# Patient Record
Sex: Female | Born: 1945 | Race: Black or African American | Hispanic: No | Marital: Single
Health system: Southern US, Community
[De-identification: ages and names within clinical notes are randomized; demographics above are authoritative.]

## PROBLEM LIST (undated history)

## (undated) DIAGNOSIS — F0390 Unspecified dementia without behavioral disturbance: Secondary | ICD-10-CM

## (undated) DIAGNOSIS — I639 Cerebral infarction, unspecified: Secondary | ICD-10-CM

## (undated) DIAGNOSIS — E785 Hyperlipidemia, unspecified: Secondary | ICD-10-CM

---

## 2018-04-07 ENCOUNTER — Emergency Department (HOSPITAL_COMMUNITY)
Admission: EM | Admit: 2018-04-07 | Discharge: 2018-04-07 | Disposition: A | Discharge status: Home / Self Care | Payer: Medicare Other | Attending: Emergency Medicine | Admitting: Emergency Medicine

## 2018-04-07 ENCOUNTER — Emergency Department (HOSPITAL_COMMUNITY): Payer: Medicare Other

## 2018-04-07 ENCOUNTER — Other Ambulatory Visit: Payer: Self-pay

## 2018-04-07 DIAGNOSIS — Z79899 Other long term (current) drug therapy: Secondary | ICD-10-CM | POA: Insufficient documentation

## 2018-04-07 DIAGNOSIS — F0390 Unspecified dementia without behavioral disturbance: Secondary | ICD-10-CM | POA: Diagnosis not present

## 2018-04-07 DIAGNOSIS — Z7902 Long term (current) use of antithrombotics/antiplatelets: Secondary | ICD-10-CM | POA: Diagnosis not present

## 2018-04-07 DIAGNOSIS — R55 Syncope and collapse: Secondary | ICD-10-CM | POA: Insufficient documentation

## 2018-04-07 DIAGNOSIS — I951 Orthostatic hypotension: Secondary | ICD-10-CM

## 2018-04-07 LAB — URINALYSIS, ROUTINE W REFLEX MICROSCOPIC
Bilirubin Urine: NEGATIVE
Glucose, UA: NEGATIVE mg/dL
Hgb urine dipstick: NEGATIVE
KETONES UR: NEGATIVE mg/dL
Leukocytes, UA: NEGATIVE
Nitrite: NEGATIVE
PH: 5 (ref 5.0–8.0)
Protein, ur: NEGATIVE mg/dL
Specific Gravity, Urine: 1.028 (ref 1.005–1.030)

## 2018-04-07 LAB — I-STAT CHEM 8, ED
BUN: 26 mg/dL — ABNORMAL HIGH (ref 8–23)
Calcium, Ion: 1.17 mmol/L (ref 1.15–1.40)
Chloride: 108 mmol/L (ref 98–111)
Creatinine, Ser: 0.9 mg/dL (ref 0.44–1.00)
GLUCOSE: 103 mg/dL — AB (ref 70–99)
HEMATOCRIT: 46 % (ref 36.0–46.0)
Hemoglobin: 15.6 g/dL — ABNORMAL HIGH (ref 12.0–15.0)
POTASSIUM: 4.2 mmol/L (ref 3.5–5.1)
Sodium: 144 mmol/L (ref 135–145)
TCO2: 31 mmol/L (ref 22–32)

## 2018-04-07 LAB — COMPREHENSIVE METABOLIC PANEL
ALK PHOS: 87 U/L (ref 38–126)
ALT: 28 U/L (ref 0–44)
ANION GAP: 7 (ref 5–15)
AST: 28 U/L (ref 15–41)
Albumin: 4.4 g/dL (ref 3.5–5.0)
BUN: 21 mg/dL (ref 8–23)
CO2: 28 mmol/L (ref 22–32)
CREATININE: 0.86 mg/dL (ref 0.44–1.00)
Calcium: 9.9 mg/dL (ref 8.9–10.3)
Chloride: 109 mmol/L (ref 98–111)
GFR calc non Af Amer: >60 mL/min (ref >60)
GLUCOSE: 101 mg/dL — AB (ref 70–99)
Potassium: 3.9 mmol/L (ref 3.5–5.1)
Sodium: 144 mmol/L (ref 135–145)
TOTAL PROTEIN: 8.1 g/dL (ref 6.5–8.1)
Total Bilirubin: 1 mg/dL (ref 0.3–1.2)

## 2018-04-07 LAB — CBC WITH DIFFERENTIAL/PLATELET
Abs Immature Granulocytes: 0.05 10*3/uL (ref 0.00–0.07)
Basophils Absolute: 0 10*3/uL (ref 0.0–0.1)
Basophils Relative: 1 %
EOS PCT: 1 %
Eosinophils Absolute: 0 10*3/uL (ref 0.0–0.5)
HEMATOCRIT: 46.9 % — AB (ref 36.0–46.0)
HEMOGLOBIN: 14.8 g/dL (ref 12.0–15.0)
Immature Granulocytes: 1 %
LYMPHS ABS: 1.2 10*3/uL (ref 0.7–4.0)
LYMPHS PCT: 18 %
MCH: 30.5 pg (ref 26.0–34.0)
MCHC: 31.6 g/dL (ref 30.0–36.0)
MCV: 96.7 fL (ref 80.0–100.0)
MONO ABS: 0.5 10*3/uL (ref 0.1–1.0)
Monocytes Relative: 7 %
Neutro Abs: 4.7 10*3/uL (ref 1.7–7.7)
Neutrophils Relative %: 72 %
Platelets: 235 10*3/uL (ref 150–400)
RBC: 4.85 MIL/uL (ref 3.87–5.11)
RDW: 13.3 % (ref 11.5–15.5)
WBC: 6.4 10*3/uL (ref 4.0–10.5)
nRBC: 0 % (ref 0.0–0.2)

## 2018-04-07 LAB — I-STAT TROPONIN, ED: TROPONIN I, POC: 0 ng/mL (ref 0.00–0.08)

## 2018-04-07 LAB — CBG MONITORING, ED: Glucose-Capillary: 83 mg/dL (ref 70–99)

## 2018-04-07 MED ORDER — SODIUM CHLORIDE 0.9 % IV BOLUS
500.0000 mL | Freq: Once | INTRAVENOUS | Status: AC
  Administered 2018-04-07: 500 mL via INTRAVENOUS

## 2018-04-07 MED ORDER — DOCUSATE SODIUM 100 MG PO CAPS
200.0000 mg | ORAL_CAPSULE | Freq: Once | ORAL | Status: AC
  Administered 2018-04-07: 200 mg via ORAL
  Filled 2018-04-07: qty 2

## 2018-04-07 MED ORDER — SODIUM CHLORIDE 0.9 % IV SOLN: INTRAVENOUS | Status: DC

## 2018-04-07 NOTE — ED Notes (Signed)
Writer went back to room to try to obtain orthostatic vital signs and an EKG, per [place] Stand[place]bigail, [place]. Writer explained to pt and visitor, but visitor did not care to listen. Visitor explained to Clinical research associatewriter that Clinical research associatewriter was being rude to visitor and needed to "lose the attitude". Visitor stated, "we won't be doing any of that," referring to the EKG and orthostatic vital signs.  Cammy CopaAbigail, PA made aware.

## 2018-04-07 NOTE — ED Notes (Signed)
Pt removed IV from her arm, attemptign to leave. Daughter has convinced pt to stay. Pt being transported to XR

## 2018-04-07 NOTE — ED Notes (Addendum)
Writer began to introduce self to pt and family member interrupted Clinical research associatewriter by stating, "I need you to draw up some discharge paperwork". Writer told visitor in room that the PA would have to speak with them and some more tests needed to be completed before patient would be ready. Visitor getting louder with Clinical research associatewriter and told Clinical research associatewriter, " discharge paperwork can be done now. The blood has been done and there is the urine, y'all can call us at the house. I'm not waiting for no paperwork". Writer ok'ed and left to speak with PA.

## 2018-04-07 NOTE — ED Provider Notes (Signed)
Medical screening examination/treatment/procedure(s) were conducted as a shared visit with non-physician practitioner(s) and myself.  I personally evaluated the patient during the encounter.  EKG Interpretation  Date/Time:  Monday April 07 2018 17:43:52 EST Ventricular Rate:  85 PR Interval:    QRS Duration: 207 QT Interval:  380 QTC Calculation: 458 R Axis:   17 Text Interpretation:  Sinus rhythm Probable left ventricular hypertrophy Confirmed by Lorre NickAllen, Tegh Franek ([id number]) on 04/07/2018 7:358:[id number] PM 72 year old female here after a near syncopal episode prior to arrival.  Patient was hypotensive.  Patient is orthostatic here and requires IV fluids.  She has had a neurological baseline according to her daughter.  Will IV hydrate discharge home   Lorre NickAllen, Brenya Taulbee, MD 04/07/18 2047

## 2018-04-07 NOTE — ED Triage Notes (Signed)
Pt BIB EMS from grocery store.  Daughter reports pt was walking through the store holding shopping cart, when her knees buckled.  Pt did not actually fall.  No LOC.  Hx of stroke 2007, dementia AOx1 at baseline.  Daughter reports pt has had more frequent need for urination, suspects UTI.  Pt in briefs, often incontinent.  VS-  90/57; HR 77; R 18; CBG 167; O2 98% RA;

## 2018-04-07 NOTE — ED Provider Notes (Signed)
[facility]-EMERGENCY DEPT Provider Note   CSN: [id number] Arrival date & time: 04/07/18  1630     History   Chief Complaint No chief complaint on file.   HPI Adriana Fox is a 72 y.o. female who lives in [place] and is here visiting for the holidays with her daughter.  She has a history of dementia.  There is a level 5 caveat due to dementia.  Patient was shopping with her daughter today and while they were at the grocery store her daughter states that her knees buckled and she would like she is getting ready to pass out.  They sat her down and her daughter reports that her eyes Rolled back that she had some shaking all over..  Her daughter thinks that she is dehydrated because she has not been drinking thoroughly.  She is also had some mild constipation.  Daughter reports that she has never had a history of this.  She does not take blood thinners.  She has not had any melena or hematochezia. HPI  No past medical history on file.  There are no active problems to display for this patient.   The histories are not reviewed yet. Please review them in the "History" navigator section and refresh this SmartLink.   OB History   None      Home Medications    Prior to Admission medications   Medication Sig Start Date End Date Taking? Authorizing Provider  atorvastatin (LIPITOR) 40 MG tablet Take 40 mg by mouth daily.   Yes [provider]  clopidogrel (PLAVIX) 75 MG tablet Take 75 mg by mouth daily.   Yes [provider]  memantine (NAMENDA) 5 MG tablet Take 5 mg by mouth daily.   Yes [provider]  Multiple Vitamins-Minerals (MULTIVITAMIN ADULT) TABS Take 1 tablet by mouth daily.   Yes [provider]  Omega-3 Fatty Acids (FISH OIL) 1000 MG CAPS Take 1 capsule by mouth daily.   Yes [provider]    Family History No family history on file.  Social History Social History   Tobacco Use  . Smoking status:  Not on file  Substance Use Topics  . Alcohol use: Not on file  . Drug use: Not on file     Allergies   Patient has no known allergies.   Review of Systems Review of Systems  Unable to review systems due to dementia  Physical Exam Updated Vital Signs BP 121/72 (BP Location: Left Arm)   Pulse 80   Resp 18   SpO2 99%   Physical Exam Physical Exam  Nursing note and vitals reviewed. Constitutional: She is oriented to person, place, and time. She appears well-developed and well-nourished. No distress.  HENT:  Head: Normocephalic and atraumatic.  Eyes: Conjunctivae normal and EOM are normal. Pupils are equal, round, and reactive to light. No scleral icterus.  Neck: Normal range of motion.  Cardiovascular: Normal rate, regular rhythm and normal heart sounds.  Exam reveals no gallop and no friction rub.   No murmur heard. Pulmonary/Chest: Effort normal and breath sounds normal. No respiratory distress.  Abdominal: Soft. Bowel sounds are normal. She exhibits no distension and no mass. There is no tenderness. There is no guarding.  Neurological: She is alert and oriented to person, place, and time.  Skin: Skin is warm and dry. She is not diaphoretic.     ED Treatments / Results  Labs (all labs ordered are listed, but only abnormal results are displayed)  Labs Reviewed  I-STAT CHEM 8, ED - Abnormal; Notable for the following components:      Result Value   BUN 26 (*)    Glucose, Bld 103 (*)    Hemoglobin 15.6 (*)    All other components within normal limits  URINALYSIS, ROUTINE W REFLEX MICROSCOPIC  CBC WITH DIFFERENTIAL/PLATELET  COMPREHENSIVE METABOLIC PANEL  CBG MONITORING, ED  I-STAT TROPONIN, ED    EKG EKG Interpretation  Date/Time:  Monday April 07 2018 17:43:52 EST Ventricular Rate:  85 PR Interval:    QRS Duration: 207 QT Interval:  380 QTC Calculation: 458 R Axis:   17 Text Interpretation:  Sinus rhythm Probable left ventricular hypertrophy Confirmed  by Lorre NickAllen, Anthony ([id number]) on 04/07/2018 7:58:48 PM   Radiology Dg Chest 2 View  Result Date: 04/07/2018 CLINICAL DATA:  Near syncopal episode. EXAM: CHEST - 2 VIEW COMPARISON:  None. FINDINGS: Cardiomediastinal silhouette is normal. Mediastinal contours appear intact. Calcific atherosclerotic disease the aorta. There is no evidence of focal airspace consolidation, pleural effusion or pneumothorax. Bilateral lower lobe scarring versus atelectasis. Osseous structures are without acute abnormality. Soft tissues are grossly normal. IMPRESSION: Bilateral lower lobe scarring versus atelectasis. Calcific atherosclerotic disease of the aorta. Electronically Signed   By: Ted Mcalpineobrinka  Dimitrova M.D.   On: 04/07/2018 19:30    Procedures Procedures (including critical care time)  Medications Ordered in ED Medications  0.9 %  sodium chloride infusion (has no administration in time range)     Initial Impression / Assessment and Plan / ED Course  I have reviewed the triage vital signs and the nursing notes.  Pertinent labs & imaging results that were available during my care of the patient were reviewed by me and considered in my medical decision making (see chart for details).     72 year old female who had a presyncopal episode.  Her EKG is reviewed and shows no significant abnormalities.  No signs of ischemia, no evidence of arrhythmia, Brugada or WPW.  Her chest x-ray I personally reviewed shows no widened mediastinum, pleural effusions.  Patient labs are largely reassuring without evidence of anemia.  She has positive orthostatic vital signs.  Patient was given fluids with improvement.  Her daughter requests Colace.  No evidence of urinary tract infection.  She was appropriate for discharge at this time.  Final Clinical Impressions(s) / ED Diagnoses   Final diagnoses:  None    ED Discharge Orders    None       Arthor CaptainHarris, Angellica Maddison, PA-C 04/07/18 2344    Lorre NickAllen, Anthony, MD 04/12/18 [phone]

## 2018-04-07 NOTE — ED Notes (Signed)
Writer went in pt's room to introduce myself and complete orders for bolus. Pt's family advised that a new IV needs to be started in order to receive fluids. Pt's family states "we are not doing all of that" and denied IV start and fluids. Made PA aware.

## 2018-04-07 NOTE — ED Notes (Signed)
Abigial, PA at bedside.

## 2019-05-13 ENCOUNTER — Emergency Department (HOSPITAL_COMMUNITY): Payer: Medicare Other

## 2019-05-13 ENCOUNTER — Other Ambulatory Visit: Payer: Self-pay

## 2019-05-13 ENCOUNTER — Inpatient Hospital Stay (HOSPITAL_COMMUNITY)
Admission: EM | Admit: 2019-05-13 | Discharge: 2019-05-24 | DRG: 871 | Disposition: A | Discharge status: Skilled Nursing Facility | Payer: Medicare Other | Attending: Internal Medicine | Admitting: Internal Medicine

## 2019-05-13 ENCOUNTER — Encounter (HOSPITAL_COMMUNITY): Payer: Self-pay

## 2019-05-13 DIAGNOSIS — Z7902 Long term (current) use of antithrombotics/antiplatelets: Secondary | ICD-10-CM

## 2019-05-13 DIAGNOSIS — Z681 Body mass index (BMI) 19 or less, adult: Secondary | ICD-10-CM | POA: Diagnosis not present

## 2019-05-13 DIAGNOSIS — I10 Essential (primary) hypertension: Secondary | ICD-10-CM | POA: Diagnosis present

## 2019-05-13 DIAGNOSIS — U071 COVID-19: Secondary | ICD-10-CM | POA: Diagnosis present

## 2019-05-13 DIAGNOSIS — E876 Hypokalemia: Secondary | ICD-10-CM | POA: Diagnosis present

## 2019-05-13 DIAGNOSIS — E860 Dehydration: Secondary | ICD-10-CM | POA: Diagnosis present

## 2019-05-13 DIAGNOSIS — A4189 Other specified sepsis: Principal | ICD-10-CM | POA: Diagnosis present

## 2019-05-13 DIAGNOSIS — G92 Toxic encephalopathy: Secondary | ICD-10-CM | POA: Diagnosis not present

## 2019-05-13 DIAGNOSIS — E785 Hyperlipidemia, unspecified: Secondary | ICD-10-CM | POA: Diagnosis present

## 2019-05-13 DIAGNOSIS — R0602 Shortness of breath: Secondary | ICD-10-CM | POA: Diagnosis present

## 2019-05-13 DIAGNOSIS — I69354 Hemiplegia and hemiparesis following cerebral infarction affecting left non-dominant side: Secondary | ICD-10-CM

## 2019-05-13 DIAGNOSIS — F0390 Unspecified dementia without behavioral disturbance: Secondary | ICD-10-CM | POA: Diagnosis present

## 2019-05-13 DIAGNOSIS — I6930 Unspecified sequelae of cerebral infarction: Secondary | ICD-10-CM

## 2019-05-13 DIAGNOSIS — R64 Cachexia: Secondary | ICD-10-CM | POA: Diagnosis present

## 2019-05-13 DIAGNOSIS — J1282 Pneumonia due to coronavirus disease 2019: Secondary | ICD-10-CM | POA: Diagnosis present

## 2019-05-13 DIAGNOSIS — A419 Sepsis, unspecified organism: Secondary | ICD-10-CM | POA: Diagnosis not present

## 2019-05-13 DIAGNOSIS — F419 Anxiety disorder, unspecified: Secondary | ICD-10-CM | POA: Diagnosis not present

## 2019-05-13 DIAGNOSIS — R9431 Abnormal electrocardiogram [ECG] [EKG]: Secondary | ICD-10-CM | POA: Diagnosis present

## 2019-05-13 DIAGNOSIS — Z79899 Other long term (current) drug therapy: Secondary | ICD-10-CM

## 2019-05-13 DIAGNOSIS — J9601 Acute respiratory failure with hypoxia: Secondary | ICD-10-CM | POA: Diagnosis present

## 2019-05-13 DIAGNOSIS — R791 Abnormal coagulation profile: Secondary | ICD-10-CM | POA: Diagnosis not present

## 2019-05-13 DIAGNOSIS — E870 Hyperosmolality and hypernatremia: Secondary | ICD-10-CM | POA: Diagnosis present

## 2019-05-13 DIAGNOSIS — J1289 Other viral pneumonia: Secondary | ICD-10-CM | POA: Diagnosis not present

## 2019-05-13 HISTORY — DX: Cerebral infarction, unspecified: I63.9

## 2019-05-13 HISTORY — DX: Hyperlipidemia, unspecified: E78.5

## 2019-05-13 HISTORY — DX: Unspecified dementia, unspecified severity, without behavioral disturbance, psychotic disturbance, mood disturbance, and anxiety: F03.90

## 2019-05-13 LAB — CBC WITH DIFFERENTIAL/PLATELET
Abs Immature Granulocytes: 0.07 10*3/uL (ref 0.00–0.07)
Basophils Absolute: 0.1 10*3/uL (ref 0.0–0.1)
Basophils Relative: 1 %
Eosinophils Absolute: 0.1 10*3/uL (ref 0.0–0.5)
Eosinophils Relative: 1 %
HCT: 40.9 % (ref 36.0–46.0)
Hemoglobin: 13.2 g/dL (ref 12.0–15.0)
Immature Granulocytes: 1 %
Lymphocytes Relative: 20 %
Lymphs Abs: 1.9 10*3/uL (ref 0.7–4.0)
MCH: 30.7 pg (ref 26.0–34.0)
MCHC: 32.3 g/dL (ref 30.0–36.0)
MCV: 95.1 fL (ref 80.0–100.0)
Monocytes Absolute: 0.6 10*3/uL (ref 0.1–1.0)
Monocytes Relative: 6 %
Neutro Abs: 7 10*3/uL (ref 1.7–7.7)
Neutrophils Relative %: 71 %
Platelets: 275 10*3/uL (ref 150–400)
RBC: 4.3 MIL/uL (ref 3.87–5.11)
RDW: 15.9 % — ABNORMAL HIGH (ref 11.5–15.5)
WBC: 9.6 10*3/uL (ref 4.0–10.5)
nRBC: 4.8 % — ABNORMAL HIGH (ref 0.0–0.2)

## 2019-05-13 LAB — COMPREHENSIVE METABOLIC PANEL
ALT: 54 U/L — ABNORMAL HIGH (ref 0–44)
AST: 56 U/L — ABNORMAL HIGH (ref 15–41)
Albumin: 2.8 g/dL — ABNORMAL LOW (ref 3.5–5.0)
Alkaline Phosphatase: 135 U/L — ABNORMAL HIGH (ref 38–126)
Anion gap: 14 (ref 5–15)
BUN: 29 mg/dL — ABNORMAL HIGH (ref 8–23)
CO2: 22 mmol/L (ref 22–32)
Calcium: 8.7 mg/dL — ABNORMAL LOW (ref 8.9–10.3)
Chloride: 110 mmol/L (ref 98–111)
Creatinine, Ser: 1 mg/dL (ref 0.44–1.00)
GFR calc Af Amer: >60 mL/min (ref >60)
GFR calc non Af Amer: 56 mL/min — ABNORMAL LOW (ref >60)
Glucose, Bld: 120 mg/dL — ABNORMAL HIGH (ref 70–99)
Potassium: 3.1 mmol/L — ABNORMAL LOW (ref 3.5–5.1)
Sodium: 146 mmol/L — ABNORMAL HIGH (ref 135–145)
Total Bilirubin: 1 mg/dL (ref 0.3–1.2)
Total Protein: 7.2 g/dL (ref 6.5–8.1)

## 2019-05-13 LAB — LACTIC ACID, PLASMA
Lactic Acid, Venous: 2.7 mmol/L (ref 0.5–1.9)
Lactic Acid, Venous: 2.4 mmol/L (ref 0.5–1.9)

## 2019-05-13 LAB — D-DIMER, QUANTITATIVE: D-Dimer, Quant: >20 ug/mL-FEU — ABNORMAL HIGH (ref 0.00–0.50)

## 2019-05-13 LAB — FIBRINOGEN: Fibrinogen: 355 mg/dL (ref 210–475)

## 2019-05-13 LAB — TRIGLYCERIDES: Triglycerides: 85 mg/dL (ref <150)

## 2019-05-13 LAB — FERRITIN: Ferritin: 336 ng/mL — ABNORMAL HIGH (ref 11–307)

## 2019-05-13 LAB — ABO/RH: ABO/RH(D): O POS

## 2019-05-13 LAB — C-REACTIVE PROTEIN: CRP: 4.3 mg/dL — ABNORMAL HIGH (ref <1.0)

## 2019-05-13 LAB — POC SARS CORONAVIRUS 2 AG -  ED: SARS Coronavirus 2 Ag: NEGATIVE

## 2019-05-13 LAB — LACTATE DEHYDROGENASE: LDH: 353 U/L — ABNORMAL HIGH (ref 98–192)

## 2019-05-13 LAB — SARS CORONAVIRUS 2 (TAT 6-24 HRS): SARS Coronavirus 2: POSITIVE — AB

## 2019-05-13 LAB — PROCALCITONIN: Procalcitonin: 0.19 ng/mL

## 2019-05-13 MED ORDER — SODIUM CHLORIDE 0.9 % IV SOLN
100.0000 mg | Freq: Every day | INTRAVENOUS | Status: DC
  Administered 2019-05-14 – 2019-05-16 (×3): 100 mg via INTRAVENOUS
  Filled 2019-05-13 (×4): qty 20

## 2019-05-13 MED ORDER — ASCORBIC ACID 500 MG PO TABS
500.0000 mg | ORAL_TABLET | Freq: Every day | ORAL | Status: DC
  Administered 2019-05-13 – 2019-05-24 (×12): 500 mg via ORAL
  Filled 2019-05-13 (×12): qty 1

## 2019-05-13 MED ORDER — SODIUM CHLORIDE 0.9 % IV SOLN: Freq: Once | INTRAVENOUS | Status: AC

## 2019-05-13 MED ORDER — ENOXAPARIN SODIUM 40 MG/0.4ML ~~LOC~~ SOLN
40.0000 mg | SUBCUTANEOUS | Status: DC
  Administered 2019-05-13 – 2019-05-14 (×2): 40 mg via SUBCUTANEOUS
  Filled 2019-05-13 (×2): qty 0.4

## 2019-05-13 MED ORDER — POTASSIUM CHLORIDE 10 MEQ/100ML IV SOLN
10.0000 meq | INTRAVENOUS | Status: DC
  Administered 2019-05-13: 10 meq via INTRAVENOUS
  Filled 2019-05-13: qty 100

## 2019-05-13 MED ORDER — MEMANTINE HCL 5 MG PO TABS
5.0000 mg | ORAL_TABLET | Freq: Every day | ORAL | Status: DC
  Administered 2019-05-13 – 2019-05-24 (×12): 5 mg via ORAL
  Filled 2019-05-13 (×13): qty 1

## 2019-05-13 MED ORDER — SODIUM CHLORIDE 0.9 % IV SOLN: INTRAVENOUS | Status: DC

## 2019-05-13 MED ORDER — ALBUTEROL SULFATE HFA 108 (90 BASE) MCG/ACT IN AERS
2.0000 | INHALATION_SPRAY | Freq: Four times a day (QID) | RESPIRATORY_TRACT | Status: DC
  Administered 2019-05-13 – 2019-05-24 (×38): 2 via RESPIRATORY_TRACT
  Filled 2019-05-13 (×2): qty 6.7

## 2019-05-13 MED ORDER — FAMOTIDINE 20 MG PO TABS
20.0000 mg | ORAL_TABLET | Freq: Two times a day (BID) | ORAL | Status: DC
  Administered 2019-05-13 – 2019-05-24 (×22): 20 mg via ORAL
  Filled 2019-05-13 (×24): qty 1

## 2019-05-13 MED ORDER — SODIUM CHLORIDE 0.9% FLUSH
3.0000 mL | Freq: Two times a day (BID) | INTRAVENOUS | Status: DC
  Administered 2019-05-13 – 2019-05-24 (×22): 3 mL via INTRAVENOUS

## 2019-05-13 MED ORDER — CLOPIDOGREL BISULFATE 75 MG PO TABS
75.0000 mg | ORAL_TABLET | Freq: Every day | ORAL | Status: DC
  Administered 2019-05-13 – 2019-05-24 (×12): 75 mg via ORAL
  Filled 2019-05-13 (×12): qty 1

## 2019-05-13 MED ORDER — ATORVASTATIN CALCIUM 40 MG PO TABS
40.0000 mg | ORAL_TABLET | Freq: Every day | ORAL | Status: DC
  Administered 2019-05-13 – 2019-05-24 (×12): 40 mg via ORAL
  Filled 2019-05-13 (×12): qty 1

## 2019-05-13 MED ORDER — GUAIFENESIN-DM 100-10 MG/5ML PO SYRP
10.0000 mL | ORAL_SOLUTION | ORAL | Status: DC | PRN
  Administered 2019-05-17 – 2019-05-21 (×5): 10 mL via ORAL
  Filled 2019-05-13 (×5): qty 10

## 2019-05-13 MED ORDER — ZINC SULFATE 220 (50 ZN) MG PO CAPS
220.0000 mg | ORAL_CAPSULE | Freq: Every day | ORAL | Status: DC
  Administered 2019-05-13 – 2019-05-24 (×10): 220 mg via ORAL
  Filled 2019-05-13 (×12): qty 1

## 2019-05-13 MED ORDER — DEXAMETHASONE SODIUM PHOSPHATE 10 MG/ML IJ SOLN
6.0000 mg | INTRAMUSCULAR | Status: DC
  Administered 2019-05-13: 6 mg via INTRAVENOUS
  Filled 2019-05-13: qty 1

## 2019-05-13 MED ORDER — POTASSIUM CHLORIDE 10 MEQ/100ML IV SOLN
10.0000 meq | INTRAVENOUS | Status: AC
  Administered 2019-05-13 (×2): 10 meq via INTRAVENOUS
  Filled 2019-05-13 (×2): qty 100

## 2019-05-13 MED ORDER — SODIUM CHLORIDE 0.9 % IV SOLN
200.0000 mg | Freq: Once | INTRAVENOUS | Status: AC
  Administered 2019-05-13: 200 mg via INTRAVENOUS
  Filled 2019-05-13: qty 40

## 2019-05-13 MED ORDER — POTASSIUM CHLORIDE 10 MEQ/100ML IV SOLN
10.0000 meq | INTRAVENOUS | Status: AC
  Administered 2019-05-13: 10 meq via INTRAVENOUS
  Filled 2019-05-13: qty 100

## 2019-05-13 MED ORDER — IOHEXOL 350 MG/ML SOLN
100.0000 mL | Freq: Once | INTRAVENOUS | Status: AC | PRN
  Administered 2019-05-13: 100 mL via INTRAVENOUS

## 2019-05-13 NOTE — ED Notes (Signed)
Report given to floor.  Pt requested some water, given.  Pt appears to beable to express wants and needs.

## 2019-05-13 NOTE — Plan of Care (Signed)
  Problem: Respiratory: Goal: Will maintain a patent airway Outcome: Progressing   

## 2019-05-13 NOTE — ED Notes (Signed)
Placed purewick on patient for urine collection

## 2019-05-13 NOTE — H&P (Signed)
History and Physical    Adriana Fox PIR:[id number] DOB: 12/17/1945 DOA: 05/13/2019  Referring MD/NP/PA: Jennette Kettle, MD PCP: Patient, No Pcp Per  Patient coming from: Home via EMS  Chief Complaint: Shortness of breath  I have personally briefly reviewed patient's old medical records in [place]   HPI: Adriana Fox is a 73 y.o. female with medical history significant of dementia, hyperlipidemia, CVA with residual left-sided weakness, and diagnosed with COVID-19 on 12/17.  Patient presents with complaints of worsening shortness of breath.  History is obtained from the patient's daughter in over the phone due to patient's significant dementia.  Patient had been admitted at had been admitted to [facility] for 2 days last week after being diagnosed with COVID-19, but was discharged home without any prescriptions.  Since being home patient continues to be short of breath with any activity.  Normally getting around with the use of a cane.  After getting home from the hospital patient started having this dry cough and her breathing appeared labored whenever she was getting around.  Over the last 3 days patient has been restless at night and unable to sleep due to her symptoms.  Her daughter did not report her mother having any recurrence of fevers. Denies having any significant nausea not any reports of nausea, vomiting, or diarrhea.  [name] does not report any complaints at this time.  En route with EMS patient was noted to be febrile up to 101.3 F, O2 saturation 98% on room air, heart rate 118, and blood pressure 103/47.  Patient was given 400 mL of normal saline IV fluids with improvement.  ED Course: Upon admission into the emergency department patient was noted to have a temperature of 99 F, pulse 110-116, respiration 22-36, and O2 saturation maintained on room air.  Point-of-care COVID-19 screening was negative.  Chest x-ray showed patchy bilateral opacities.  CT  angiogram significant for interstitial opacities likely related to history of COVID-19.  TRH called to admit.  Review of Systems  Unable to perform ROS: Dementia  Constitutional: Positive for malaise/fatigue. Negative for fever.  Respiratory: Positive for cough and shortness of breath.   Psychiatric/Behavioral: Positive for memory loss.    Past Medical History:  Diagnosis Date  . CVA (cerebral vascular accident) ([place])   . Dementia ([place])   . Hyperlipidemia     History reviewed. No pertinent surgical history.  Has no history of tobacco, alcohol, and drug.  No Known Allergies  Family History  Problem Relation Age of Onset  . Diabetes Mother     Prior to Admission medications   Medication Sig Start Date End Date Taking? Authorizing Provider  atorvastatin (LIPITOR) 40 MG tablet Take 40 mg by mouth daily.    [provider]  clopidogrel (PLAVIX) 75 MG tablet Take 75 mg by mouth daily.    [provider]  memantine (NAMENDA) 5 MG tablet Take 5 mg by mouth daily.    [provider]  Multiple Vitamins-Minerals (MULTIVITAMIN ADULT) TABS Take 1 tablet by mouth daily.    [provider]  Omega-3 Fatty Acids (FISH OIL) 1000 MG CAPS Take 1 capsule by mouth daily.    [provider]    Physical Exam:  Constitutional: Thin elderly female who appears to be ill but in no acute distress. Vitals:   05/13/19 0530 05/13/19 0600 05/13/19 0631 05/13/19 0700  BP: 99/78 112/79 103/82 105/81  Pulse: (!) 110 (!) 111 (!) 113 (!) 109  Resp: Marland Kitchen)  36 (!) 22 (!) 26 (!) 32  Temp:      TempSrc:      SpO2: 94% 98% 95% 96%  Weight:      Height:       Eyes: PERRL, lids and conjunctivae normal ENMT: Mucous membranes are dry. Posterior pharynx clear of any exudate or lesions.  Neck: normal, supple, no masses, no thyromegaly Respiratory: Tachypneic with no significant wheezes or rhonchi appreciated.  Patient currently maintaining O2 saturations on room air.   Appears to be in some respiratory distress after saying 2-3 words.   Cardiovascular: Tachycardic, no murmurs / rubs / gallops. No extremity edema. 2+ pedal pulses. No carotid bruits.  Abdomen: no tenderness, no masses palpated. No hepatosplenomegaly. Bowel sounds positive.  Musculoskeletal: no clubbing / cyanosis. No joint deformity upper and lower extremities.  Mild contracture noted of the left hand.  Normal muscle tone.  Skin: no rashes, lesions, ulcers. No induration Neurologic: CN 2-12 grossly intact. Sensation intact, DTR normal. Strength 4/5 on the left upper and lower extremity.  Strength 5/5 in the right upper and lower extremity.  Psychiatric: Poor memory.  Patient alert and oriented only to self at this time.  Labs on Admission: I have personally reviewed following labs and imaging studies  CBC: Recent Labs  Lab 05/13/19 0420  WBC 9.6  NEUTROABS 7.0  HGB 13.2  HCT 40.9  MCV 95.1  PLT 275   Basic Metabolic Panel: Recent Labs  Lab 05/13/19 0420  NA 146*  K 3.1*  CL 110  CO2 22  GLUCOSE 120*  BUN 29*  CREATININE 1.00  CALCIUM 8.7*   GFR: Estimated Creatinine Clearance: 43 mL/min (by C-G formula based on SCr of 1 mg/dL). Liver Function Tests: Recent Labs  Lab 05/13/19 0420  AST 56*  ALT 54*  ALKPHOS 135*  BILITOT 1.0  PROT 7.2  ALBUMIN 2.8*   No results for input(s): LIPASE, AMYLASE in the last 168 hours. No results for input(s): AMMONIA in the last 168 hours. Coagulation Profile: No results for input(s): INR, PROTIME in the last 168 hours. Cardiac Enzymes: No results for input(s): CKTOTAL, CKMB, CKMBINDEX, TROPONINI in the last 168 hours. BNP (last 3 results) No results for input(s): PROBNP in the last 8760 hours. HbA1C: No results for input(s): HGBA1C in the last 72 hours. CBG: No results for input(s): GLUCAP in the last 168 hours. Lipid Profile: Recent Labs    05/13/19 0420  TRIG 85   Thyroid Function Tests: No results for input(s): TSH,  T4TOTAL, FREET4, T3FREE, THYROIDAB in the last 72 hours. Anemia Panel: Recent Labs    05/13/19 0420  FERRITIN 336*   Urine analysis:    Component Value Date/Time   COLORURINE YELLOW 04/07/2018 2006   APPEARANCEUR HAZY (A) 04/07/2018 2006   LABSPEC 1.028 04/07/2018 2006   PHURINE 5.0 04/07/2018 2006   GLUCOSEU NEGATIVE 04/07/2018 2006   HGBUR NEGATIVE 04/07/2018 2006   BILIRUBINUR NEGATIVE 04/07/2018 2006   KETONESUR NEGATIVE 04/07/2018 2006   PROTEINUR NEGATIVE 04/07/2018 2006   NITRITE NEGATIVE 04/07/2018 2006   LEUKOCYTESUR NEGATIVE 04/07/2018 2006   Sepsis Labs: No results found for this or any previous visit (from the past 240 hour(s)).   Radiological Exams on Admission: DG Chest Port 1 View  Result Date: 05/13/2019 CLINICAL DATA:  Shortness of breath.  COVID-19 positive EXAM: PORTABLE CHEST 1 VIEW COMPARISON:  04/07/2018 FINDINGS: Diffuse interstitial coarsening with patchy airspace type opacities at the left more than right base. No effusion or pneumothorax.  Normal heart size and stable aortic contours. Artifact from EKG leads IMPRESSION: Patchy airspace and interstitial opacity correlating with history of COVID-19. Electronically Signed   By: Marnee Spring M.D.   On: 05/13/2019 04:34    EKG: Independently reviewed.  Sinus tachycardia 116 bpm with QTc 506  Assessment/Plan Sepsis secondary to pneumonia due to COVID-19: Patient was initially noted to be febrile up to 101.3 F with EMS, tachycardic, and tachypneic.  Chest x-ray showing patchy interstitial opacities concerning for pneumonia due to COVID-19.  Patient maintaining O2 saturations on room air currently.  Labs significant for lactic acid of 2.7. -Admit to a medical telemetry bed -COVID-19 admission order set utilized -Continuous pulse oximetry with nasal cannula oxygen as needed to maintain O2 saturations greater than 90% -Albuterol inhaler -Decadron 6 mg IV -Pepcid for GI prophylaxis -Antitussives as  needed -Vitamin C and zinc -Remdesivir for pharmacy  Hypokalemia: Acute.  Initial potassium noted to be 3.1. -Give 40 mEq of potassium chloride -Continue to monitor and replace as needed  Prolonged QT interval: Acute initial QTc noted to be 506 on admission. -Limit QT prolonging medications -Correct electrolyte abnormalities -Recheck EKG in a.m.  Dehydration: Patient with significantly elevated BUN to creatinine ratio suggesting dehydration. -Normal saline IV fluids 75 mL/h  Dementia: At baseline patient has dementia is severe, and she is only oriented to self. -Continue Namenda  CVA with residual deficit: Patient previously had a stroke in 2012 with residual left-sided weakness and memory deficit.  He ambulates with the use of a cane at baseline. -Up with assistance  Hyperlipidemia -Continue atorvastatin  DVT prophylaxis:Lovenox  Code: Full Family communication: Discussed plan of care with the patient's daughter Ronney Asters over the phone.  Other person listed should not be called. Disposition: To be determined Consults called: None Admission status: Inpatient  Clydie Braun MD [facility] Pager [phone]   If 7PM-7AM, please contact night-coverage [email or web address] Password [email or web address]  05/13/2019, 8:28 AM

## 2019-05-13 NOTE — ED Notes (Signed)
Dinner Tray Ordered @ 1712. 

## 2019-05-13 NOTE — ED Notes (Signed)
Daughter at bedside informed this RN she would be leaving to go to work. Daughter Noland Fordyce requests to be updated through-out  mothers care.

## 2019-05-13 NOTE — ED Triage Notes (Signed)
Pt arrives via [facility] from home c/o weakness, hypotension, and dyspnea. Pt tested positive for COVID on 12/17. PMHX: dementia. Alert and oriented to baseline. VS: BP 103/47, HR 118, Temp 101.3, SPO2 98% RA.

## 2019-05-13 NOTE — ED Notes (Signed)
UTO lactic from line, phlebotomy to try.

## 2019-05-13 NOTE — ED Provider Notes (Signed)
[facility] EMERGENCY DEPARTMENT Provider Note   CSN: [id number] Arrival date & time: 05/13/19  6440     History Chief Complaint  Patient presents with  . Hypotension  . Weakness  . Shortness of Breath    Adriana Fox is a 73 y.o. female.  The history is provided by the EMS personnel.  Weakness Shortness of Breath   LEVEL V CAVEAT:  DEMENTIA  73 y.o. F with hx of dementia, presenting to the ED with worsening SOB.  She was reportedly diagnosed with COVID-19 on 04/30/19 by test from PCP office.  Has had worsening symptoms.  She denies any significant cough or chest pain.  No nausea/vomiting.  States she has been able to eat/drink.  She was hypotensive with EMS around 90 systolic which improved with 400cc bolus.  BP on arrival 103/76.    Past Medical History:  Diagnosis Date  . Dementia (HCC)     There are no problems to display for this patient.     OB History   No obstetric history on file.     No family history on file.  Social History   Tobacco Use  . Smoking status: Not on file  Substance Use Topics  . Alcohol use: Not on file  . Drug use: Not on file    Home Medications Prior to Admission medications   Medication Sig Start Date End Date Taking? Authorizing Provider  atorvastatin (LIPITOR) 40 MG tablet Take 40 mg by mouth daily.    [provider]  clopidogrel (PLAVIX) 75 MG tablet Take 75 mg by mouth daily.    [provider]  memantine (NAMENDA) 5 MG tablet Take 5 mg by mouth daily.    [provider]  Multiple Vitamins-Minerals (MULTIVITAMIN ADULT) TABS Take 1 tablet by mouth daily.    [provider]  Omega-3 Fatty Acids (FISH OIL) 1000 MG CAPS Take 1 capsule by mouth daily.    [provider]    Allergies    Patient has no known allergies.  Review of Systems   Review of Systems  Unable to perform ROS: Dementia    Physical Exam Updated Vital Signs BP 104/76 (BP Location:  Right Arm)   Pulse (!) 116   Temp 99 F (37.2 C) (Oral)   Resp (!) 30   Ht 5\' 6"  (1.676 Fox)   Wt 54.4 kg   SpO2 97%   BMI 19.37 kg/Fox   Physical Exam Vitals and nursing note reviewed.  Constitutional:      Appearance: She is well-developed.     Comments: Pleasantly confused, fidgeting with EKG leads during exam  HENT:     Head: Normocephalic and atraumatic.  Eyes:     Conjunctiva/sclera: Conjunctivae normal.     Pupils: Pupils are equal, round, and reactive to light.  Cardiovascular:     Rate and Rhythm: Normal rate and regular rhythm.     Heart sounds: Normal heart sounds.  Pulmonary:     Effort: Pulmonary effort is normal. Tachypnea present.     Breath sounds: Normal breath sounds. No wheezing or rhonchi.     Comments: tachypneic around 30 but lungs grossly clear Abdominal:     General: Bowel sounds are normal.     Palpations: Abdomen is soft.  Musculoskeletal:        General: Normal range of motion.     Cervical back: Normal range of motion.  Skin:    General: Skin is warm and dry.  Neurological:  Mental Status: She is alert and oriented to person, place, and time.     ED Results / Procedures / Treatments   Labs (all labs ordered are listed, but only abnormal results are displayed) Labs Reviewed  LACTIC ACID, PLASMA - Abnormal; Notable for the following components:      Result Value   Lactic Acid, Venous 2.7 (*)    All other components within normal limits  CBC WITH DIFFERENTIAL/PLATELET - Abnormal; Notable for the following components:   RDW 15.9 (*)    nRBC 4.8 (*)    All other components within normal limits  COMPREHENSIVE METABOLIC PANEL - Abnormal; Notable for the following components:   Sodium 146 (*)    Potassium 3.1 (*)    Glucose, Bld 120 (*)    BUN 29 (*)    Calcium 8.7 (*)    Albumin 2.8 (*)    AST 56 (*)    ALT 54 (*)    Alkaline Phosphatase 135 (*)    GFR calc non Af Amer 56 (*)    All other components within normal limits  D-DIMER,  QUANTITATIVE (NOT AT [facility]) - Abnormal; Notable for the following components:   D-Dimer, Quant >20.00 (*)    All other components within normal limits  LACTATE DEHYDROGENASE - Abnormal; Notable for the following components:   LDH 353 (*)    All other components within normal limits  FERRITIN - Abnormal; Notable for the following components:   Ferritin 336 (*)    All other components within normal limits  C-REACTIVE PROTEIN - Abnormal; Notable for the following components:   CRP 4.3 (*)    All other components within normal limits  CULTURE, BLOOD (ROUTINE X 2)  CULTURE, BLOOD (ROUTINE X 2)  SARS CORONAVIRUS 2 (TAT 6-24 HRS)  TRIGLYCERIDES  FIBRINOGEN  LACTIC ACID, PLASMA  PROCALCITONIN  PATHOLOGIST SMEAR REVIEW  POC SARS CORONAVIRUS 2 AG -  ED    EKG None  Radiology DG Chest Port 1 View  Result Date: 05/13/2019 CLINICAL DATA:  Shortness of breath.  COVID-19 positive EXAM: PORTABLE CHEST 1 VIEW COMPARISON:  04/07/2018 FINDINGS: Diffuse interstitial coarsening with patchy airspace type opacities at the left more than right base. No effusion or pneumothorax. Normal heart size and stable aortic contours. Artifact from EKG leads IMPRESSION: Patchy airspace and interstitial opacity correlating with history of COVID-19. Electronically Signed   By: Monte Fantasia Fox.D.   On: 05/13/2019 04:34    Procedures Procedures (including critical care time)  CRITICAL CARE Performed by: Larene Pickett   Total critical care time: 40 minutes  Critical care time was exclusive of separately billable procedures and treating other patients.  Critical care was necessary to treat or prevent imminent or life-threatening deterioration.  Critical care was time spent personally by me on the following activities: development of treatment plan with patient and/or surrogate as well as nursing, discussions with consultants, evaluation of patient's response to treatment, examination of patient, obtaining  history from patient or surrogate, ordering and performing treatments and interventions, ordering and review of laboratory studies, ordering and review of radiographic studies, pulse oximetry and re-evaluation of patient's condition.   Medications Ordered in ED Medications - No data to display  ED Course  I have reviewed the triage vital signs and the nursing notes.  Pertinent labs & imaging results that were available during my care of the patient were reviewed by me and considered in my medical decision making (see chart for details).  MDM Rules/Calculators/A&P  73 y.o. F here with SOB.  Diagnosed with COVID on 04/30/19, worsening SOB now.  Initially was hypotensive with EMS but responded well to 400cc bolus.  She is AAO to her baseline here, seems pleasantly confused, fidgeting with EKG leads during exam.  She is tachypneic and tachycardic, breathing around 30-40 times a minute.  Her lung sounds are grossly clear and she is in no acute distress.  Difficulty obtaining pulse ox due to her nail polish, readings anywhere from 85 to 91%.  Will obtain labs, portable CXR.  COVID test result not available in Epic so will obtain swab here.  Rapid swab is negative however chest x-ray with patchy airspace and interstitial opacities correlating with COVID-19.  Labs with elevated lactate but normal white blood cell count.  She does have some elevation in LFTs as well as LDH, also is consistent with COVID-19.  5:50 AM Daughter now here in ER.  Reports they recently moved down to [place] from [place] to get some assistance from family.  [facility] (no records available, ?misspelled name) but only had a 2 day stay and daughter did not think she was ready to be discharged.  States she has had increased work of breathing over the past few days, generalized weakness.  RR remains around 36 now, still tachy around 110.  She will require admission for ongoing care.  6:03  AM Discussed with hospitalist, Dr. Julian ReilGardner-- morning team will admit.  While on the phone, d-dimer came back >20 I have ordered CTA of the chest to assess for PE.  Dr. Julian ReilGardner aware of new orders and will notify morning team to follow-up on results.  Final Clinical Impression(s) / ED Diagnoses Final diagnoses:  COVID-19  Shortness of breath    Rx / DC Orders ED Discharge Orders    None       Garlon HatchetSanders, Adriana Kimm M, PA-C 05/13/19 [id number]    Gilda CreasePollina, Adriana J, MD 05/14/19 [phone]

## 2019-05-14 DIAGNOSIS — U071 COVID-19: Secondary | ICD-10-CM

## 2019-05-14 DIAGNOSIS — A419 Sepsis, unspecified organism: Secondary | ICD-10-CM

## 2019-05-14 DIAGNOSIS — J1289 Other viral pneumonia: Secondary | ICD-10-CM

## 2019-05-14 LAB — COMPREHENSIVE METABOLIC PANEL
ALT: 57 U/L — ABNORMAL HIGH (ref 0–44)
AST: 57 U/L — ABNORMAL HIGH (ref 15–41)
Albumin: 2.6 g/dL — ABNORMAL LOW (ref 3.5–5.0)
Alkaline Phosphatase: 156 U/L — ABNORMAL HIGH (ref 38–126)
Anion gap: 14 (ref 5–15)
BUN: 27 mg/dL — ABNORMAL HIGH (ref 8–23)
CO2: 19 mmol/L — ABNORMAL LOW (ref 22–32)
Calcium: 8.2 mg/dL — ABNORMAL LOW (ref 8.9–10.3)
Chloride: 115 mmol/L — ABNORMAL HIGH (ref 98–111)
Creatinine, Ser: 0.84 mg/dL (ref 0.44–1.00)
GFR calc Af Amer: >60 mL/min (ref >60)
GFR calc non Af Amer: >60 mL/min (ref >60)
Glucose, Bld: 111 mg/dL — ABNORMAL HIGH (ref 70–99)
Potassium: 3.6 mmol/L (ref 3.5–5.1)
Sodium: 148 mmol/L — ABNORMAL HIGH (ref 135–145)
Total Bilirubin: 1.1 mg/dL (ref 0.3–1.2)
Total Protein: 6.6 g/dL (ref 6.5–8.1)

## 2019-05-14 LAB — CBC WITH DIFFERENTIAL/PLATELET
Abs Immature Granulocytes: 0.08 10*3/uL — ABNORMAL HIGH (ref 0.00–0.07)
Basophils Absolute: 0 10*3/uL (ref 0.0–0.1)
Basophils Relative: 0 %
Eosinophils Absolute: 0 10*3/uL (ref 0.0–0.5)
Eosinophils Relative: 0 %
HCT: 38.2 % (ref 36.0–46.0)
Hemoglobin: 12.1 g/dL (ref 12.0–15.0)
Immature Granulocytes: 1 %
Lymphocytes Relative: 17 %
Lymphs Abs: 1.8 10*3/uL (ref 0.7–4.0)
MCH: 30.7 pg (ref 26.0–34.0)
MCHC: 31.7 g/dL (ref 30.0–36.0)
MCV: 97 fL (ref 80.0–100.0)
Monocytes Absolute: 0.6 10*3/uL (ref 0.1–1.0)
Monocytes Relative: 6 %
Neutro Abs: 8.4 10*3/uL — ABNORMAL HIGH (ref 1.7–7.7)
Neutrophils Relative %: 76 %
Platelets: 234 10*3/uL (ref 150–400)
RBC: 3.94 MIL/uL (ref 3.87–5.11)
RDW: 17 % — ABNORMAL HIGH (ref 11.5–15.5)
WBC: 10.9 10*3/uL — ABNORMAL HIGH (ref 4.0–10.5)
nRBC: 7.6 % — ABNORMAL HIGH (ref 0.0–0.2)

## 2019-05-14 LAB — LACTIC ACID, PLASMA
Lactic Acid, Venous: 2.6 mmol/L (ref 0.5–1.9)
Lactic Acid, Venous: 1.9 mmol/L (ref 0.5–1.9)

## 2019-05-14 LAB — BLOOD GAS, ARTERIAL
Acid-base deficit: 3.9 mmol/L — ABNORMAL HIGH (ref 0.0–2.0)
Acid-base deficit: 3.2 mmol/L — ABNORMAL HIGH (ref 0.0–2.0)
Bicarbonate: 19.2 mmol/L — ABNORMAL LOW (ref 20.0–28.0)
Bicarbonate: 19.8 mmol/L — ABNORMAL LOW (ref 20.0–28.0)
FIO2: 80
FIO2: 60
O2 Saturation: 71.7 %
O2 Saturation: 72.7 %
Patient temperature: 36.4
Patient temperature: 36.5
pCO2 arterial: 27 mmHg — ABNORMAL LOW (ref 32.0–48.0)
pCO2 arterial: 26.3 mmHg — ABNORMAL LOW (ref 32.0–48.0)
pH, Arterial: 7.466 — ABNORMAL HIGH (ref 7.350–7.450)
pH, Arterial: 7.487 — ABNORMAL HIGH (ref 7.350–7.450)
pO2, Arterial: 45 mmHg — ABNORMAL LOW (ref 83.0–108.0)
pO2, Arterial: 42.6 mmHg — ABNORMAL LOW (ref 83.0–108.0)

## 2019-05-14 LAB — MAGNESIUM: Magnesium: 2.3 mg/dL (ref 1.7–2.4)

## 2019-05-14 LAB — PHOSPHORUS: Phosphorus: 3.3 mg/dL (ref 2.5–4.6)

## 2019-05-14 LAB — D-DIMER, QUANTITATIVE: D-Dimer, Quant: >20 ug/mL-FEU — ABNORMAL HIGH (ref 0.00–0.50)

## 2019-05-14 LAB — C-REACTIVE PROTEIN: CRP: 4.7 mg/dL — ABNORMAL HIGH (ref <1.0)

## 2019-05-14 LAB — FERRITIN: Ferritin: 379 ng/mL — ABNORMAL HIGH (ref 11–307)

## 2019-05-14 LAB — PATHOLOGIST SMEAR REVIEW

## 2019-05-14 LAB — PROCALCITONIN: Procalcitonin: 0.19 ng/mL

## 2019-05-14 MED ORDER — DEXAMETHASONE SODIUM PHOSPHATE 10 MG/ML IJ SOLN
6.0000 mg | INTRAMUSCULAR | Status: DC
  Administered 2019-05-15 – 2019-05-16 (×2): 6 mg via INTRAVENOUS
  Filled 2019-05-14 (×2): qty 1

## 2019-05-14 MED ORDER — TOCILIZUMAB 400 MG/20ML IV SOLN: 8.0000 mg/kg | Freq: Once | INTRAVENOUS | Status: DC

## 2019-05-14 MED ORDER — ENOXAPARIN SODIUM 40 MG/0.4ML ~~LOC~~ SOLN
40.0000 mg | Freq: Two times a day (BID) | SUBCUTANEOUS | Status: DC
  Administered 2019-05-14 – 2019-05-15 (×2): 40 mg via SUBCUTANEOUS
  Filled 2019-05-14 (×2): qty 0.4

## 2019-05-14 MED ORDER — VANCOMYCIN HCL IN DEXTROSE 1-5 GM/200ML-% IV SOLN
1000.0000 mg | INTRAVENOUS | Status: DC
  Administered 2019-05-14: 1000 mg via INTRAVENOUS
  Filled 2019-05-14 (×2): qty 200

## 2019-05-14 MED ORDER — DEXTROSE 5 % IV SOLN: INTRAVENOUS | Status: DC

## 2019-05-14 MED ORDER — DEXAMETHASONE SODIUM PHOSPHATE 10 MG/ML IJ SOLN
6.0000 mg | Freq: Two times a day (BID) | INTRAMUSCULAR | Status: DC
  Administered 2019-05-14: 6 mg via INTRAVENOUS
  Filled 2019-05-14: qty 1

## 2019-05-14 NOTE — Consult Note (Signed)
   NAME:  Adriana Fox, MRN:  [id number], DOB:  [birth year], LOS: 1 ADMISSION DATE:  05/13/2019, CONSULTATION DATE:  05/14/19 REFERRING MD:  Frederic Jericho, CHIEF COMPLAINT:  SOB   Brief History   This is a 73 year old woman with a history of significant dementia presenting with COVID pneumonia./  History of present illness   This is a 73 year old woman with a history of significant dementia presenting with worsening shortness of breath.  She was found to have COVID-19.  PCCM consulted for worsening oxygenation.  History is per chart review as patient is very confused.  Patient states she has mild pain but unable to articulate where it is coming from. Denies SOB. History otherwise extremely limited, per nursing discussion with daughter this is about baseline.  Past Medical History  Dementia Hypertension Hyperlipidemia  Significant Hospital Events   12/30 admitted  Consults:  PCCM  Procedures:  N/A  Significant Diagnostic Tests:  CXR bilateral airspace disease  Micro Data:  Pct 0.19  Antimicrobials:  Remdesivir >>  Interim history/subjective:  Consulted  Objective   Blood pressure 110/73, pulse 99, temperature 97.8 F (36.6 C), temperature source Oral, resp. rate (!) 36, height 5\' 6"  (1.676 m), weight 50.1 kg, SpO2 99 %.        Intake/Output Summary (Last 24 hours) at 05/14/2019 1004 Last data filed at 05/14/2019 8502 Gross per 24 hour  Intake --  Output 100 ml  Net -100 ml   Filed Weights   05/13/19 0345 05/13/19 2201  Weight: 54.4 kg 50.1 kg    Examination: Frail cachectic elderly woman sitting in bed Mucous membranes dry, trachea midline Lung sounds are clear bilaterally, there is no accessory muscle use Abdomen is soft, positive bowel sounds She has muscle wasting but no edema Moves all 4 extremities to command  Kidney function good Potassium little low Mild transaminitis Ferritin 300, stable from yesterday CRP 4.7 stable from yesterday Lactate mildly  elevated stable Procalcitonin negative D-dimer greater than 20  Resolved Hospital Problem list   N/A  Assessment & Plan:  # Acte hypoxemic respiratory failure due to COVID Pneumonia # Advanced dementia # Frail elderly - Sats much better on forehead probe - She is in no distress - I would titrate O2 based on pulse oximetry and clinical appearance rather than ABGs in this particular circumstance - I do not think Actemra is indicated in COVID based on available literature - Dexamethasone and remdesivir should be given for usual dose and course - Do not think she will be able to prone due to cognitive ability - If she does not turn around I would strongly advocate for goals of care discussion, it would be cruel to place this woman on the ventilator - Available PRN  Erskine Emery MD PCCM

## 2019-05-14 NOTE — Progress Notes (Signed)
MD stated to contact [facility]. RR contacted, in route.

## 2019-05-14 NOTE — Progress Notes (Addendum)
Report called to [place].   [facility] called and notified of transfer. Patient is on the list for transport but was told that most likely patient will not be transferred until after 1900.

## 2019-05-14 NOTE — Significant Event (Addendum)
[facility] Event Note  Overview:  Inability to get accurate oxygen saturations/Acute Hypoxia  Initial Focused Assessment: Notified by nursing staff of elevated MEWS and inability to obtain accurate pulse oximetry readings. Upon arrival, Adriana Fox is alert, oriented to self and disoriented to place, time and situation. Pt has a baseline dementia so hard to assess how more or less confused She is disruptive to medical devices due to her confusion/delerium. Multiple attempts were made to obtain a O2 saturation reading. Adriana Fox has gel nailpolish that is unable to remove. An accurate reading was obtained for a brief period showing 74-77% on 4L [place]. Pt was immediately placed on [place] 15L HFNC and sats came up to 80-81%. I notified RT and Deboraha Sprang and Octavia Bruckner came to add a splitter on the flowmeter to deliver Salter HFNC and NRB mask both at 15L. Sats came up to 84%. ABG obtained. Pts skin is pale and cyanotic around her lips, cool to the touch and dry. BBS coarse with diminished bases. No cough witnessed. Very pungent odor from her breath. Pt is not in distress. Minimal to no accessory muscle use. Clance Boll NP notified. POC discussed. After ABG resulted, PCCM consulted for assistance. Recommended to keep pt on NRB/Salter mix. Recheck ABG at 0800. Communicated with nursing staff.   0515- 97.5 F ax, HR 106 ST, 105/79 (89), RR 24, sats 84% on NRB/Salter 15L.   ABG- 7.46/27/45/19 on Salter 15L [place]   Interventions: -Salter and increasing to Salter/NRB mix -Stat ABG (above)  Plan of Care (if not transferred): -Maintain HOB >30 degrees -Apply mittens if pt continues to be disruptive to devices -Goal Sats >88% per Dr. Lucile Shutters -ABG 0800 to reassess hypoxia/POC/Escalation of care  Event Summary: Call received 0445 Arrived at call 0455 Call ended 0645  Madelynn Done

## 2019-05-14 NOTE — Progress Notes (Signed)
CBG 72 had to override patient writstband barcode not working.

## 2019-05-14 NOTE — Progress Notes (Signed)
PROGRESS NOTE    Adriana Fox  ZOX:[id number] DOB: 1945-11-28 DOA: 05/13/2019 PCP: Patient, No Pcp Per   Brief Narrative: 73 year old with past medical history significant for dementia, hyperlipidemia, CVA with residual left-sided weakness, diagnosed with COVID-19 on 12/17.  Present with worsening shortness of breath.  History was obtained from family as patient has significant dementia. Patient was found to be febrile, temperature 101 oxygen saturation 98 on room air.  CT angiogram showed interstitial opacity likely related to history of COVID-19. The night of admission, patient became more hypoxic requiring 15 L of high flow oxygen.  Subsequently decreased to 10 L of high flow.  Patient is able to tolerate nonrebreather mask we will proceed to transfer her to [facility].   Assessment & Plan:   Principal Problem:   Sepsis (HCC) Active Problems:   Pneumonia due to COVID-19 virus   Hypokalemia   Dementia (HCC)   Prolonged QT interval   History of CVA with residual deficit   Dehydration   1-Sepsis: Patient present with fever, tachycardia, tachypnea.  Lactic acid mildly elevated 2.6. -IV bolus. -Blood cultures 1 out of 2 growing staph coagulase-negative.  Might be a contaminant but will proceed to start vancomycin. -repeat Blood cultures tomorrow.   2-Acute Hypoxic Respiratory Failure; secondary to covid 19 PNA -CT angio negative for PE.  -Requiring 10 L HF oxygen. If she is able to tolerates NB Mask will proceed with transfer to [facility].  -I was going to give patient Actemra, but her blood culture is growing Staph Aureous. Will hold on give Actemra for that reason.  -CCM was consulted.  -Continue with Dexamethasone, and Remdesivir.  -Lovenox 4 mg BID. D dimer more than 20.   3-Hypernatremia; will change IV fluids to D 5.  4-Mild transaminases; in setting of Covid, monitor.  5-Prolong QT; check EKG>  Dementia; continue with namenda.  CVA, with residual deficit; continue with  atorvastatin.       Estimated body mass index is 17.83 kg/m as calculated from the following:   Height as of this encounter:  (1.676 m).   Weight as of this encounter: 50.1 kg.   DVT prophylaxis: Lovenox Code Status: Full code Family Communication: Daughter updated Disposition Plan: remain in the hospital for treatment of covid 19 Consultants:   CCM  Procedures:   None  Antimicrobials:  Vancomycin 05-14-2019  Subjective: Alert, does no appears in distress. Confuse. Denies dyspnea  Objective: Vitals:   05/14/19 0515 05/14/19 0530 05/14/19 0822 05/14/19 0838  BP:   110/73   Pulse:   99   Resp:      Temp:   97.8 F (36.6 C)   TempSrc:   Oral   SpO2: (!) 80% (!) 84% 90% 99%  Weight:      Height:        Intake/Output Summary (Last 24 hours) at 05/14/2019 0856 Last data filed at 05/14/2019 0452 Gross per 24 hour  Intake -  Output 100 ml  Net -100 ml   Filed Weights   05/13/19 0345 05/13/19 2201  Weight: 54.4 kg 50.1 kg    Examination:  General exam: Appears calm and comfortable  Respiratory system: crackles bases. Respiratory effort normal. Cardiovascular system: S1 & S2 heard, RRR. No JVD, murmurs, rubs, gallops or clicks. No pedal edema. Gastrointestinal system: Abdomen is nondistended, soft and nontender. No organomegaly or masses felt. Normal bowel sounds heard. Central nervous system: Alert Extremities: Symmetric 5 x 5 power. Skin: No rashes, lesions or ulcers  Data Reviewed: I have personally reviewed following labs and imaging studies  CBC: Recent Labs  Lab 05/13/19 0420 05/14/19 0418  WBC 9.6 10.9*  NEUTROABS 7.0 8.4*  HGB 13.2 12.1  HCT 40.9 38.2  MCV 95.1 97.0  PLT 275 234   Basic Metabolic Panel: Recent Labs  Lab 05/13/19 0420 05/14/19 0418  NA 146* 148*  K 3.1* 3.6  CL 110 115*  CO2 22 19*  GLUCOSE 120* 111*  BUN 29* 27*  CREATININE 1.00 0.84  CALCIUM 8.7* 8.2*  MG  --  2.3  PHOS  --  3.3   GFR: Estimated  Creatinine Clearance: 47.2 mL/min (by C-G formula based on SCr of 0.84 mg/dL). Liver Function Tests: Recent Labs  Lab 05/13/19 0420 05/14/19 0418  AST 56* 57*  ALT 54* 57*  ALKPHOS 135* 156*  BILITOT 1.0 1.1  PROT 7.2 6.6  ALBUMIN 2.8* 2.6*   No results for input(s): LIPASE, AMYLASE in the last 168 hours. No results for input(s): AMMONIA in the last 168 hours. Coagulation Profile: No results for input(s): INR, PROTIME in the last 168 hours. Cardiac Enzymes: No results for input(s): CKTOTAL, CKMB, CKMBINDEX, TROPONINI in the last 168 hours. BNP (last 3 results) No results for input(s): PROBNP in the last 8760 hours. HbA1C: No results for input(s): HGBA1C in the last 72 hours. CBG: No results for input(s): GLUCAP in the last 168 hours. Lipid Profile: Recent Labs    05/13/19 0420  TRIG 85   Thyroid Function Tests: No results for input(s): TSH, T4TOTAL, FREET4, T3FREE, THYROIDAB in the last 72 hours. Anemia Panel: Recent Labs    05/13/19 0420 05/14/19 0418  FERRITIN 336* 379*   Sepsis Labs: Recent Labs  Lab 05/13/19 0420 05/13/19 0422 05/13/19 1220 05/14/19 0727  PROCALCITON 0.19  --   --   --   LATICACIDVEN  --  2.7* 2.4* 2.6*    Recent Results (from the past 240 hour(s))  Blood Culture (routine x 2)     Status: None (Preliminary result)   Collection Time: 05/13/19  4:22 AM   Specimen: BLOOD  Result Value Ref Range Status   Specimen Description BLOOD LEFT ANTECUBITAL  Final   Special Requests   Final    BOTTLES DRAWN AEROBIC AND ANAEROBIC Blood Culture adequate volume   Culture  Setup Time   Final    IN BOTH AEROBIC AND ANAEROBIC BOTTLES GRAM POSITIVE COCCI CRITICAL RESULT CALLED TO, READ BACK BY AND VERIFIED WITH: J LEDFORD PHARMD 05/13/19 2240 JDW    Culture   Final    CULTURE REINCUBATED FOR BETTER GROWTH Performed at [facility], [address] [place], [place] [address]    Report Status PENDING  Incomplete  Blood Culture (routine x 2)      Status: None (Preliminary result)   Collection Time: 05/13/19  4:22 AM   Specimen: BLOOD  Result Value Ref Range Status   Specimen Description BLOOD RIGHT ANTECUBITAL  Final   Special Requests   Final    BOTTLES DRAWN AEROBIC AND ANAEROBIC Blood Culture adequate volume   Culture   Final    NO GROWTH < 12 HOURS Performed at [facility], [address] [place], [place] [address]    Report Status PENDING  Incomplete  SARS CORONAVIRUS 2 (TAT 6-24 HRS) Nasopharyngeal Nasopharyngeal Swab     Status: Abnormal   Collection Time: 05/13/19  5:26 AM   Specimen: Nasopharyngeal Swab  Result Value Ref Range Status  SARS Coronavirus 2 POSITIVE (A) NEGATIVE Final    Comment: emailed L. Berdik RN 14:15 05/13/19 ([email or web address]) (NOTE) SARS-CoV-2 target nucleic acids are DETECTED. The SARS-CoV-2 RNA is generally detectable in upper and lower respiratory specimens during the acute phase of infection. Positive results are indicative of the presence of SARS-CoV-2 RNA. Clinical correlation with patient history and other diagnostic information is  necessary to determine patient infection status. Positive results do not rule out bacterial infection or co-infection with other viruses.  The expected result is Negative. Fact Sheet for Patients: HairSlick.no Fact Sheet for Healthcare Providers: quierodirigir.com This test is not yet approved or cleared by the [place] FDA and  has been authorized for detection and/or diagnosis of SARS-CoV-2 by FDA under an Emergency Use Authorization (EUA). This EUA will remain  in effect (meaning this test can be used) for the duration of the COVID-19 declaration un der Section 564(b)(1) of the Act, 21 U.S.C. section 360bbb-3(b)(1), unless the authorization is terminated or revoked sooner. Performed at [facility], [address], [place], [place] [address]          Radiology Studies: CT Angio  Chest PE W and/or Wo Contrast  Result Date: 05/13/2019 CLINICAL DATA:  Show less of breath and positive D-dimer EXAM: CT ANGIOGRAPHY CHEST WITH CONTRAST TECHNIQUE: Multidetector CT imaging of the chest was performed using the standard protocol during bolus administration of intravenous contrast. Multiplanar CT image reconstructions and MIPs were obtained to evaluate the vascular anatomy. CONTRAST:  OMNIPAQUE IOHEXOL 350 MG/ML SOLN COMPARISON:  None. FINDINGS: Cardiovascular: Overall normal heart size but asymmetric dilatation of the right heart. Pulmonary artery visualization is degraded by motion artifact there is diagnostic opacification and visualization to the segmental level throughout the majority of the lungs. No filling defect is seen. There is streaming artifact in the left main pulmonary artery. Mild atherosclerotic calcification of the aorta. Mediastinum/Nodes: Paucity of mediastinal fat with no suspected adenopathy or mass. Lungs/Pleura: Subpleural opacity with coarse reticular appearance. Some of this may be chronic as there is suggestion of some honeycombing and traction bronchiectasis. Dependent atelectasis. No edema, effusion, or pneumothorax. Upper Abdomen: Bilateral hepatic cystic densities. Early opacification of the aorta. Musculoskeletal: No acute or aggressive finding. Review of the MIP images confirms the above findings. IMPRESSION: 1. Interstitial opacities likely related to history of COVID 19. There may be underlying interstitial lung disease. 2. Probable right heart dilatation but no evidence of pulmonary embolism. Electronically Signed   By: Marnee Spring M.D.   On: 05/13/2019 08:26   DG Chest Port 1 View  Result Date: 05/13/2019 CLINICAL DATA:  Shortness of breath.  COVID-19 positive EXAM: PORTABLE CHEST 1 VIEW COMPARISON:  04/07/2018 FINDINGS: Diffuse interstitial coarsening with patchy airspace type opacities at the left more than right base. No effusion or  pneumothorax. Normal heart size and stable aortic contours. Artifact from EKG leads IMPRESSION: Patchy airspace and interstitial opacity correlating with history of COVID-19. Electronically Signed   By: Marnee Spring M.D.   On: 05/13/2019 04:34        Scheduled Meds: . albuterol  2 puff Inhalation Q6H  . vitamin C  500 mg Oral Daily  . atorvastatin  40 mg Oral Daily  . clopidogrel  75 mg Oral Daily  . dexamethasone (DECADRON) injection  6 mg Intravenous Q12H  . enoxaparin (LOVENOX) injection  40 mg Subcutaneous Q24H  . famotidine  20 mg Oral BID  . memantine  5 mg Oral Daily  . sodium  chloride flush  3 mL Intravenous Q12H  . zinc sulfate  220 mg Oral Daily   Continuous Infusions: . sodium chloride 75 mL/hr at 05/13/19 1926  . remdesivir 100 mg in NS 100 mL       LOS: 1 day    Time spent: 35 minutes.     Elmarie Shiley, MD [facility]   If 7PM-7AM, please contact night-coverage [email or web address] Password [email or web address] 05/14/2019, 8:56 AM

## 2019-05-14 NOTE — Progress Notes (Signed)
Pharmacy Antibiotic Note  Adriana Fox is a 73 y.o. female admitted on 05/13/2019 with COVID.  1/2 blood cx with coag neg staph - likely contaminant   Plan: Vanc 1 g q24h F/u repeat bcx but can likely dc vanc in am  Height: 5\' 6"  (167.6 cm) Weight: 110 lb 7.2 oz (50.1 kg) IBW/kg (Calculated) : 59.3  Temp (24hrs), Avg:97.9 F (36.6 C), Min:97.5 F (36.4 C), Max:98.9 F (37.2 C)  Recent Labs  Lab 05/13/19 0420 05/13/19 0422 05/13/19 1220 05/14/19 0418 05/14/19 0727  WBC 9.6  --   --  10.9*  --   CREATININE 1.00  --   --  0.84  --   LATICACIDVEN  --  2.7* 2.4*  --  2.6*    Estimated Creatinine Clearance: 47.2 mL/min (by C-G formula based on SCr of 0.84 mg/dL).    No Known Allergies  Barth Kirks, PharmD, BCPS, BCCCP Clinical Pharmacist [phone]  Please check AMION for all [place] numbers  05/14/2019 1:57 PM

## 2019-05-14 NOTE — Progress Notes (Signed)
Pts MEWS has been RED since admission. MEWS fired due to RR (30s-40s), BP (SBP: 80s), and Pulse (ST, low 100s). Rapid Response and On Call Baltazar Najjar) contacted. No orders given

## 2019-05-14 NOTE — Progress Notes (Signed)
RN paged MD Baltazar Najjar). Unable to obtain pts O2. Attempted, ears, forehead, toes, and fingers. Tried dinamap and bedside O2 device. Awaiting orders.

## 2019-05-14 NOTE — Progress Notes (Signed)
Shift event: RN paged because she couldn't pick up an O2 sat and her MEWS was red. Asked RN to page RRRN.  Spoke to [name] who states pt is not in distress and is not working hard to breathe. When he can get a decent wave form, the O2 sat is in the 70s. Pt has gel fingernail polish on, so we can not remove that. Pt is confused, but has a hx of dementia, so team unsure as to the etiology of that being hypoxia vs. dementia. She is not sleepy.  ABG done to accurately read the O2 and her pO2 in the 40s. Pt was placed on 15L salter plus a 15L NRB and last peripheral O2 sat 84%.  NP called PCCM to see what they would do next. We can not do aerolizing procedures and she doesn't appear to need intubation at this time.  PCCM said that they would give the increased O2 more time, like 1-2 hours, and if O2 sat 88 or above, that is good.  Given ? O2 sat, will r/p ABG in 2 hours. Left sign out for oncoming attending.  KJKG, NP [facility]

## 2019-05-15 ENCOUNTER — Encounter (HOSPITAL_COMMUNITY): Payer: Self-pay

## 2019-05-15 ENCOUNTER — Encounter (HOSPITAL_COMMUNITY): Payer: Federal, State, Local not specified - PPO

## 2019-05-15 ENCOUNTER — Other Ambulatory Visit: Payer: Self-pay

## 2019-05-15 DIAGNOSIS — J1282 Pneumonia due to coronavirus disease 2019: Secondary | ICD-10-CM

## 2019-05-15 LAB — CBC WITH DIFFERENTIAL/PLATELET
Abs Immature Granulocytes: 0.07 10*3/uL (ref 0.00–0.07)
Basophils Absolute: 0 10*3/uL (ref 0.0–0.1)
Basophils Relative: 0 %
Eosinophils Absolute: 0 10*3/uL (ref 0.0–0.5)
Eosinophils Relative: 0 %
HCT: 35.5 % — ABNORMAL LOW (ref 36.0–46.0)
Hemoglobin: 11.7 g/dL — ABNORMAL LOW (ref 12.0–15.0)
Immature Granulocytes: 1 %
Lymphocytes Relative: 15 %
Lymphs Abs: 1.3 10*3/uL (ref 0.7–4.0)
MCH: 31.7 pg (ref 26.0–34.0)
MCHC: 33 g/dL (ref 30.0–36.0)
MCV: 96.2 fL (ref 80.0–100.0)
Monocytes Absolute: 0.6 10*3/uL (ref 0.1–1.0)
Monocytes Relative: 7 %
Neutro Abs: 6.6 10*3/uL (ref 1.7–7.7)
Neutrophils Relative %: 77 %
Platelets: 184 10*3/uL (ref 150–400)
RBC: 3.69 MIL/uL — ABNORMAL LOW (ref 3.87–5.11)
RDW: 17.3 % — ABNORMAL HIGH (ref 11.5–15.5)
WBC: 8.6 10*3/uL (ref 4.0–10.5)
nRBC: 6.3 % — ABNORMAL HIGH (ref 0.0–0.2)

## 2019-05-15 LAB — COMPREHENSIVE METABOLIC PANEL
ALT: 49 U/L — ABNORMAL HIGH (ref 0–44)
AST: 45 U/L — ABNORMAL HIGH (ref 15–41)
Albumin: 2.7 g/dL — ABNORMAL LOW (ref 3.5–5.0)
Alkaline Phosphatase: 164 U/L — ABNORMAL HIGH (ref 38–126)
Anion gap: 9 (ref 5–15)
BUN: 28 mg/dL — ABNORMAL HIGH (ref 8–23)
CO2: 21 mmol/L — ABNORMAL LOW (ref 22–32)
Calcium: 8.3 mg/dL — ABNORMAL LOW (ref 8.9–10.3)
Chloride: 114 mmol/L — ABNORMAL HIGH (ref 98–111)
Creatinine, Ser: 0.83 mg/dL (ref 0.44–1.00)
GFR calc Af Amer: >60 mL/min (ref >60)
GFR calc non Af Amer: >60 mL/min (ref >60)
Glucose, Bld: 114 mg/dL — ABNORMAL HIGH (ref 70–99)
Potassium: 3.5 mmol/L (ref 3.5–5.1)
Sodium: 144 mmol/L (ref 135–145)
Total Bilirubin: 0.6 mg/dL (ref 0.3–1.2)
Total Protein: 6.3 g/dL — ABNORMAL LOW (ref 6.5–8.1)

## 2019-05-15 LAB — CULTURE, BLOOD (ROUTINE X 2): Special Requests: ADEQUATE

## 2019-05-15 LAB — FERRITIN: Ferritin: 303 ng/mL (ref 11–307)

## 2019-05-15 LAB — MAGNESIUM: Magnesium: 2.4 mg/dL (ref 1.7–2.4)

## 2019-05-15 LAB — C-REACTIVE PROTEIN: CRP: 3.5 mg/dL — ABNORMAL HIGH (ref <1.0)

## 2019-05-15 LAB — PHOSPHORUS: Phosphorus: 3.1 mg/dL (ref 2.5–4.6)

## 2019-05-15 LAB — D-DIMER, QUANTITATIVE: D-Dimer, Quant: >20 ug/mL-FEU — ABNORMAL HIGH (ref 0.00–0.50)

## 2019-05-15 LAB — PROCALCITONIN: Procalcitonin: 0.28 ng/mL

## 2019-05-15 LAB — GLUCOSE, CAPILLARY: Glucose-Capillary: 88 mg/dL (ref 70–99)

## 2019-05-15 MED ORDER — ENOXAPARIN SODIUM 60 MG/0.6ML ~~LOC~~ SOLN
50.0000 mg | Freq: Two times a day (BID) | SUBCUTANEOUS | Status: DC
  Administered 2019-05-16 – 2019-05-17 (×3): 50 mg via SUBCUTANEOUS
  Filled 2019-05-15 (×3): qty 0.6

## 2019-05-15 MED ORDER — POTASSIUM CHLORIDE CRYS ER 20 MEQ PO TBCR
40.0000 meq | EXTENDED_RELEASE_TABLET | Freq: Once | ORAL | Status: AC
  Administered 2019-05-15: 40 meq via ORAL
  Filled 2019-05-15: qty 2

## 2019-05-15 MED ORDER — LACTATED RINGERS IV SOLN: INTRAVENOUS | Status: DC

## 2019-05-15 NOTE — Progress Notes (Addendum)
ANTICOAGULATION CONSULT NOTE - Initial Consult  Pharmacy Consult for enoxaparin Indication: elevated D-dimer in the setting of COVID19  No Known Allergies  Patient Measurements: Height: 5\' 6"  (167.6 cm) Weight: 110 lb 7.2 oz (50.1 kg) IBW/kg (Calculated) : 59.3  Vital Signs: Temp: 98.4 F (36.9 C) (01/01 0816) Temp Source: Axillary (01/01 0816) BP: 97/70 (01/01 1200) Pulse Rate: 103 (01/01 1200)  Labs: Recent Labs    05/13/19 0420 05/14/19 0418 05/15/19 0258  HGB 13.2 12.1 11.7*  HCT 40.9 38.2 35.5*  PLT 275 234 184  CREATININE 1.00 0.84 0.83    Estimated Creatinine Clearance: 47.7 mL/min (by C-G formula based on SCr of 0.83 mg/dL).   Medical History: Past Medical History:  Diagnosis Date  . CVA (cerebral vascular accident) (HCC)   . Dementia (HCC)   . Hyperlipidemia     Medications:  Scheduled:  . albuterol  2 puff Inhalation Q6H  . vitamin C  500 mg Oral Daily  . atorvastatin  40 mg Oral Daily  . clopidogrel  75 mg Oral Daily  . dexamethasone (DECADRON) injection  6 mg Intravenous Q24H  . famotidine  20 mg Oral BID  . memantine  5 mg Oral Daily  . sodium chloride flush  3 mL Intravenous Q12H  . zinc sulfate  220 mg Oral Daily    Assessment: 73 [facility]. Pt is not on oral anticoagulation PTA. D-dimer elevated >20, CTA ruled out PE, LE venous doppler ordered to rule out DVT. Pharmacy consulted for full-dose enoxapain.  Pt was started on enoxaparin 40 mg subq q12h on 12/31 due to elevated D-dimer, last dose given on 1/1/ at 1140. Hgb 11.7, Plt wnl at 184. SCr stable at 0.83.   Goal of Therapy:  Monitor platelets by anticoagulation protocol: Yes   Plan:  Lovenox 50 mg (1 mg/kg) subq q12h starting tonight F/u venous doppler for DVT rule out  1/32, PharmD PGY1 Pharmacy Resident  Please check AMION for all [facility] Pharmacy phone numbers After 10:00 PM, call Main Pharmacy [phone]  05/15/2019,12:33 PM

## 2019-05-15 NOTE — Progress Notes (Signed)
PROGRESS NOTE                                                                                                                                                                                                             Patient Demographics:    Adriana Fox, is a 74 y.o. female, DOB - April 25, 1946, LXB:[id number]  Outpatient Primary MD for the patient is Patient, No Pcp Per    LOS - 2  Admit date - 05/13/2019    Chief Complaint  Patient presents with  . Hypotension  . Weakness  . Shortness of Breath       Brief Narrative 74 year old with past medical history significant for dementia, hyperlipidemia, CVA with residual left-sided weakness, diagnosed with COVID-19 on 12/17.  Present with worsening shortness of breath, was diagnosed with acute hypoxic respiratory failure due to COVID-19 pneumonia and admitted to the hospital.   Subjective:    Adriana Fox today has, No headache, No chest pain, No abdominal pain - No Nausea, No new weakness tingling or numbness, denies any shortness of breath.   Assessment  & Plan :    1. Acute Hypoxic Resp. Failure, Sepsis due to Acute Covid 19 Viral Pneumonitis during the ongoing 2020 Covid 19 Pandemic - she had moderate to severe disease along with sepsis and severe dehydration.  Sepsis pathophysiology has resolved, she has been treated with IV steroids and remdesivir with good effect.  Continue IV fluids for hydration.  Blood cultures 1 out of 2 showing contaminant coag negative staph.  Stop all antibiotics.  Advance activity, titrate down steroids and oxygen as tolerated.  She is currently on 8 L nasal cannula oxygen down from 15.  Encouraged the patient to sit up in chair in the daytime use I-S and flutter valve for pulmonary toiletry and then prone in bed when at night.   Recent Labs  Lab 05/13/19 0420 05/13/19 0526 05/14/19 0418 05/15/19 0258  CRP 4.3*  --  4.7* 3.5*  DDIMER  >20.00*  --  >20.00* >20.00*  FERRITIN 336*  --  379* 303  PROCALCITON 0.19  --  0.19 0.28  SARSCOV2NAA  --  POSITIVE*  --   --     Hepatic Function Latest Ref Rng & Units 05/15/2019 05/14/2019 05/13/2019  Total Protein 6.5 - 8.1 g/dL 6.3(L) 6.6 7.2  Albumin 3.5 -  5.0 g/dL 2.7(L) 2.6(L) 2.8(L)  AST 15 - 41 U/L 45(H) 57(H) 56(H)  ALT 0 - 44 U/L 49(H) 57(H) 54(H)  Alk Phosphatase 38 - 126 U/L 164(H) 156(H) 135(H)  Total Bilirubin 0.3 - 1.2 mg/dL 0.6 1.1 1.0      2.  COVID-19 viral illness induced transaminitis.  Symptom-free, trend stable will monitor.  Is on statin monitor closely.  3.  Extremely elevated D-dimer.  CTA ruled out PE, lower extremity venous duplex, full dose Lovenox for now.  4.  Hyponatremia.  Due to dehydration.  Continue IV fluids, hyponatremia has improved.  5.  Dementia, CVA, left-sided hemiparesis.  Supportive care.  At risk for delirium, avoid benzodiazepines and narcotics.  Continue Plavix for secondary prevention along with statin.     Condition - Extremely Guarded  Family Communication  : Daughter over the phone on 05/15/2019  Code Status : Full  Diet :   Diet Order            Diet Heart Room service appropriate? Yes with Assist; Fluid consistency: Thin  Diet effective now               Disposition Plan  : To be decided  Consults  : None  Procedures  : None  PUD Prophylaxis : None  DVT Prophylaxis  :  Lovenox  High dose  Lab Results  Component Value Date   PLT 184 05/15/2019    Inpatient Medications  Scheduled Meds: . albuterol  2 puff Inhalation Q6H  . vitamin C  500 mg Oral Daily  . atorvastatin  40 mg Oral Daily  . clopidogrel  75 mg Oral Daily  . dexamethasone (DECADRON) injection  6 mg Intravenous Q24H  . famotidine  20 mg Oral BID  . memantine  5 mg Oral Daily  . sodium chloride flush  3 mL Intravenous Q12H  . zinc sulfate  220 mg Oral Daily   Continuous Infusions: . dextrose 50 mL/hr at 05/15/19 0518  . remdesivir 100  mg in NS 100 mL 100 mg (05/15/19 1141)  . vancomycin Stopped (05/15/19 0206)   PRN Meds:.guaiFENesin-dextromethorphan  Antibiotics  :    Anti-infectives (From admission, onward)   Start     Dose/Rate Route Frequency Ordered Stop   05/14/19 1500  vancomycin (VANCOCIN) IVPB 1000 mg/200 mL premix     1,000 mg 200 mL/hr over 60 Minutes Intravenous Every 24 hours 05/14/19 1354     05/14/19 1000  remdesivir 100 mg in sodium chloride 0.9 % 100 mL IVPB     100 mg 200 mL/hr over 30 Minutes Intravenous Daily 05/13/19 1730 05/18/19 0959   05/13/19 1745  remdesivir 200 mg in sodium chloride 0.9% 250 mL IVPB     200 mg 580 mL/hr over 30 Minutes Intravenous Once 05/13/19 1730 05/13/19 2035       Time Spent in minutes  30   Lala Lund M.D on 05/15/2019 at 12:24 PM  To page go to [email or web address] - password [facility]  [facility] -  Office  [phone]     See all Orders from today for further details    Objective:   Vitals:   05/15/19 0057 05/15/19 0130 05/15/19 0515 05/15/19 0816  BP: 103/73 116/76 120/86 97/75  Pulse: 87  88   Resp: (!) 25 (!) 22 20   Temp: 98.2 F (36.8 C)  98.1 F (36.7 C) 98.4 F (36.9 C)  TempSrc: Oral  Oral Axillary  SpO2: 100%  93%  Weight:      Height:        Wt Readings from Last 3 Encounters:  05/13/19 50.1 kg     Intake/Output Summary (Last 24 hours) at 05/15/2019 1224 Last data filed at 05/15/2019 0420 Gross per 24 hour  Intake 1081.4 ml  Output --  Net 1081.4 ml     Physical Exam  Awake but pleasantly confused, appears to be in no distress and mild left-sided weakness which is chronic [place].AT,PERRAL Supple Neck,No JVD, No cervical lymphadenopathy appriciated.  Symmetrical Chest wall movement, Good air movement bilaterally, CTAB RRR,No Gallops,Rubs or new Murmurs, No Parasternal Heave +ve B.Sounds, Abd Soft, No tenderness, No organomegaly appriciated, No rebound - guarding or rigidity. No Cyanosis, Clubbing or edema, No new Rash or  bruise       Data Review:    CBC Recent Labs  Lab 05/13/19 0420 05/14/19 0418 05/15/19 0258  WBC 9.6 10.9* 8.6  HGB 13.2 12.1 11.7*  HCT 40.9 38.2 35.5*  PLT 275 234 184  MCV 95.1 97.0 96.2  MCH 30.7 30.7 31.7  MCHC 32.3 31.7 33.0  RDW 15.9* 17.0* 17.3*  LYMPHSABS 1.9 1.8 1.3  MONOABS 0.6 0.6 0.6  EOSABS 0.1 0.0 0.0  BASOSABS 0.1 0.0 0.0    Chemistries  Recent Labs  Lab 05/13/19 0420 05/14/19 0418 05/15/19 0258  NA 146* 148* 144  K 3.1* 3.6 3.5  CL 110 115* 114*  CO2 22 19* 21*  GLUCOSE 120* 111* 114*  BUN 29* 27* 28*  CREATININE 1.00 0.84 0.83  CALCIUM 8.7* 8.2* 8.3*  MG  --  2.3 2.4  AST 56* 57* 45*  ALT 54* 57* 49*  ALKPHOS 135* 156* 164*  BILITOT 1.0 1.1 0.6   ------------------------------------------------------------------------------------------------------------------ Recent Labs    05/13/19 0420  TRIG 85    No results found for: HGBA1C ------------------------------------------------------------------------------------------------------------------ No results for input(s): TSH, T4TOTAL, T3FREE, THYROIDAB in the last 72 hours.  Invalid input(s): FREET3  Cardiac Enzymes No results for input(s): CKMB, TROPONINI, MYOGLOBIN in the last 168 hours.  Invalid input(s): CK ------------------------------------------------------------------------------------------------------------------ No results found for: BNP  Micro Results Recent Results (from the past 240 hour(s))  Blood Culture (routine x 2)     Status: Abnormal   Collection Time: 05/13/19  4:22 AM   Specimen: BLOOD  Result Value Ref Range Status   Specimen Description BLOOD LEFT ANTECUBITAL  Final   Special Requests   Final    BOTTLES DRAWN AEROBIC AND ANAEROBIC Blood Culture adequate volume   Culture  Setup Time   Final    IN BOTH AEROBIC AND ANAEROBIC BOTTLES GRAM POSITIVE COCCI CRITICAL RESULT CALLED TO, READ BACK BY AND VERIFIED WITH: J LEDFORD PHARMD 05/13/19 2240 JDW     Culture (A)  Final    STAPHYLOCOCCUS SPECIES (COAGULASE NEGATIVE) THE SIGNIFICANCE OF ISOLATING THIS ORGANISM FROM A SINGLE SET OF BLOOD CULTURES WHEN MULTIPLE SETS ARE DRAWN IS UNCERTAIN. PLEASE NOTIFY THE MICROBIOLOGY DEPARTMENT WITHIN ONE WEEK IF SPECIATION AND SENSITIVITIES ARE REQUIRED. Performed at [facility], [place] [address] [place], [place] [address]    Report Status 05/15/2019 FINAL  Final  Blood Culture (routine x 2)     Status: None (Preliminary result)   Collection Time: 05/13/19  4:22 AM   Specimen: BLOOD  Result Value Ref Range Status   Specimen Description BLOOD RIGHT ANTECUBITAL  Final   Special Requests   Final    BOTTLES DRAWN AEROBIC AND ANAEROBIC Blood Culture adequate volume   Culture  Final    NO GROWTH 1 DAY Performed at [facility], [place] [address], [place], [place] [address]    Report Status PENDING  Incomplete  SARS CORONAVIRUS 2 (TAT 6-24 HRS) Nasopharyngeal Nasopharyngeal Swab     Status: Abnormal   Collection Time: 05/13/19  5:26 AM   Specimen: Nasopharyngeal Swab  Result Value Ref Range Status   SARS Coronavirus 2 POSITIVE (A) NEGATIVE Final    Comment: emailed L. Berdik RN 14:15 05/13/19 ([email or web address]) (NOTE) SARS-CoV-2 target nucleic acids are DETECTED. The SARS-CoV-2 RNA is generally detectable in upper and lower respiratory specimens during the acute phase of infection. Positive results are indicative of the presence of SARS-CoV-2 RNA. Clinical correlation with patient history and other diagnostic information is  necessary to determine patient infection status. Positive results do not rule out bacterial infection or co-infection with other viruses.  The expected result is Negative. Fact Sheet for Patients: SugarRoll.be Fact Sheet for Healthcare Providers: https://www.woods-mathews.com/ This test is not yet approved or cleared by the [place] FDA and  has been authorized for detection and/or  diagnosis of SARS-CoV-2 by FDA under an Emergency Use Authorization (EUA). This EUA will remain  in effect (meaning this test can be used) for the duration of the COVID-19 declaration un der Section 564(b)(1) of the Act, 21 U.S.C. section 360bbb-3(b)(1), unless the authorization is terminated or revoked sooner. Performed at [facility], [place] [address] [place], [place] [address]     Radiology Reports CT Angio Chest PE W and/or Wo Contrast  Result Date: 05/13/2019 CLINICAL DATA:  Show less of breath and positive D-dimer EXAM: CT ANGIOGRAPHY CHEST WITH CONTRAST TECHNIQUE: Multidetector CT imaging of the chest was performed using the standard protocol during bolus administration of intravenous contrast. Multiplanar CT image reconstructions and MIPs were obtained to evaluate the vascular anatomy. CONTRAST:  136m OMNIPAQUE IOHEXOL 350 MG/ML SOLN COMPARISON:  None. FINDINGS: Cardiovascular: Overall normal heart size but asymmetric dilatation of the right heart. Pulmonary artery visualization is degraded by motion artifact there is diagnostic opacification and visualization to the segmental level throughout the majority of the lungs. No filling defect is seen. There is streaming artifact in the left main pulmonary artery. Mild atherosclerotic calcification of the aorta. Mediastinum/Nodes: Paucity of mediastinal fat with no suspected adenopathy or mass. Lungs/Pleura: Subpleural opacity with coarse reticular appearance. Some of this may be chronic as there is suggestion of some honeycombing and traction bronchiectasis. Dependent atelectasis. No edema, effusion, or pneumothorax. Upper Abdomen: Bilateral hepatic cystic densities. Early opacification of the aorta. Musculoskeletal: No acute or aggressive finding. Review of the MIP images confirms the above findings. IMPRESSION: 1. Interstitial opacities likely related to history of COVID 19. There may be underlying interstitial lung disease. 2. Probable  right heart dilatation but no evidence of pulmonary embolism. Electronically Signed   By: JMonte FantasiaM.D.   On: 05/13/2019 08:26   DG Chest Port 1 View  Result Date: 05/13/2019 CLINICAL DATA:  Shortness of breath.  COVID-19 positive EXAM: PORTABLE CHEST 1 VIEW COMPARISON:  04/07/2018 FINDINGS: Diffuse interstitial coarsening with patchy airspace type opacities at the left more than right base. No effusion or pneumothorax. Normal heart size and stable aortic contours. Artifact from EKG leads IMPRESSION: Patchy airspace and interstitial opacity correlating with history of COVID-19. Electronically Signed   By: JMonte FantasiaM.D.   On: 05/13/2019 04:34

## 2019-05-15 NOTE — Progress Notes (Signed)
Patient arrived from [facility] Unit.  Mittens in place.  Telemetry placed on patient and admitted to central monitoring.  Patient is pleasantly confused.  Called patient's daughter to notify her of patient's arrival and ask admission history questions that are overdue but no answer.    Patient is a high fall risk and falls protocol in place.  Posey fall bed pad placed under patient and activated.

## 2019-05-15 NOTE — Progress Notes (Addendum)
Went into patients room to check on her, she had her HFNC tubing in her mouth chewing on it. Patient was tachypneic with respirations at 43 breaths per minute. I removed it from her mouth and applied it back into her nose. Her oxygen saturation increased from 74% on room air to 96% on 8L of HFNC and her respirations decreased to 36 breaths per minute. Patient is currently asleep in bed, respirations are still tachypneic at 32 breaths per minute.

## 2019-05-15 NOTE — Evaluation (Signed)
Physical Therapy Evaluation Patient Details Name: Adriana Fox MRN: [id number] DOB: [birth year] Today's Date: 05/15/2019   History of Present Illness  74 year old with past medical history significant for dementia, hyperlipidemia, CVA with residual left-sided weakness, diagnosed with COVID-19 on 12/17.  Present with worsening shortness of breath, was diagnosed with acute hypoxic respiratory failure due to COVID-19 pneumonia and admitted to the hospital.  Clinical Impression   Pt admitted with above diagnosis. PTA lived with daughter, recently have come to [place] to stay with granddaughter. Prior to illness pt just needed supervision was able to ambulate (long distances as in grocery store) and also take herself to rest room etc, using only a cane. Pt currently with functional limitations due to the deficits listed below (see PT Problem List). This pm pt is very confused, at time of assessment she has chewed her right hand out of mitt and was attempting to feed herself. She was needing mod-max a with mobility, she was not able to take any steps as was loosing balance posteriorly and to right. Also with each attempt at standing pt had incidents of RR increasing to 60s also showing visible increase in breathing rate. 02 saturations were difficult to obtain, but when able to ranged from low 80s to high 80s. Pt will greatly benefit from skilled PT to increase her overall strength, balance and coordination, activity tolerance, independence and safety with mobility to allow for safe discharge. Daughter reports they are now staying in an apartment with stairs to negotiate. D/C disposition will depend on how functional pt is at time she may require post acute care level rehabilitation.    Follow Up Recommendations Home health PT;SNF;Supervision/Assistance - 24 hour    Equipment Recommendations  (TBD if able to learn to use walker instead of cane)    Recommendations for Other Services OT consult     Precautions  / Restrictions Precautions Precautions: Fall Precaution Comments: cognition Restrictions Weight Bearing Restrictions: No      Mobility  Bed Mobility Overal bed mobility: Needs Assistance Bed Mobility: Supine to Sit;Sit to Supine     Supine to sit: Mod assist Sit to supine: Mod assist      Transfers Overall transfer level: Needs assistance Equipment used: 1 person hand held assist Transfers: Sit to/from [employer] Transfers Sit to Stand: Mod assist Stand pivot transfers: Mod assist       General transfer comment: with sit to stand pt RR suddently increased to 60s (this happened x 2) and pt seemed to be breathing a lot faster than previous  Ambulation/Gait Ambulation/Gait assistance: Max assist Gait Distance (Feet): 2 Feet Assistive device: 1 person hand held assist       General Gait Details: attempted to take some steps at edge of bed but pt is unable to come to full stand and is noted to be leaning to back and to right  Stairs            Wheelchair Mobility    Modified Rankin (Stroke Patients Only)       Balance Overall balance assessment: Needs assistance Sitting-balance support: Feet supported Sitting balance-Leahy Scale: Good Sitting balance - Comments: able to sit in recliner and reach to pick up objects from floor, able to manipulate objets with hands w/o LOB Postural control: Posterior lean;Right lateral lean(w/ standing ) Standing balance support: Bilateral upper extremity supported Standing balance-Leahy Scale: Poor  Pertinent Vitals/Pain Pain Assessment: Faces Faces Pain Scale: No hurt    Home Living Family/patient expects to be discharged to:: Private residence Living Arrangements: Children Available Help at Discharge: Family Type of Home: Apartment Home Access: Stairs to enter   Technical brewer of Steps: flight   Home Equipment: Kasandra Knudsen - single point Additional Comments:  information from daughter, family has come to visit when pt got sick, currently staying w/ pts granddaughter in [place]    Prior Function Level of Independence: Independent with assistive device(s)         Comments: pt was able to get aorund on her own with cane, able to get herself to bathroom etc with supervision, was able to ambulate long distances such as grocery store     Hand Dominance        Extremity/Trunk Assessment   Upper Extremity Assessment Upper Extremity Assessment: Generalized weakness    Lower Extremity Assessment Lower Extremity Assessment: Generalized weakness       Communication   Communication: Expressive difficulties  Cognition Arousal/Alertness: Awake/alert Behavior During Therapy: Agitated;Anxious Overall Cognitive Status: Impaired/Different from baseline Area of Impairment: Orientation;Attention;Memory;Following commands;Safety/judgement;Awareness;Problem solving                 Orientation Level: Disoriented to;Place;Time;Situation   Memory: Decreased recall of precautions;Decreased short-term memory Following Commands: Follows one step commands inconsistently;Follows one step commands with increased time Safety/Judgement: Decreased awareness of safety;Decreased awareness of deficits   Problem Solving: Decreased initiation;Difficulty sequencing;Requires verbal cues;Requires tactile cues General Comments: therapist arrived to room to find pt had chewed through one of her mitts to get hand free, she had attempted to feed herself with free hand      General Comments General comments (skin integrity, edema, etc.): pt on 9L/min via HFNC sats difficult to read at times will flash in low to high 80s, pt not in distress when sitting in chair, when attempted standing did have 2 episodes of RR in 60s.    Exercises     Assessment/Plan    PT Assessment Patient needs continued PT services  PT Problem List Decreased strength;Decreased activity  tolerance;Decreased balance;Decreased mobility;Decreased coordination;Decreased cognition;Decreased safety awareness;Decreased knowledge of use of DME       PT Treatment Interventions DME instruction;Gait training;Stair training;Functional mobility training;Therapeutic activities;Therapeutic exercise;Balance training;Neuromuscular re-education;Patient/family education    PT Goals (Current goals can be found in the Care Plan section)  Acute Rehab PT Goals Patient Stated Goal: daughter needs pt to be as close to baseline as possible to be able to go home again PT Goal Formulation: With patient/family Time For Goal Achievement: 05/29/19 Potential to Achieve Goals: Fair    Frequency Min 3X/week   Barriers to discharge Other (comment) not living in home environment at this time, staying in town to visit family when got sick, pt is presenting with significant decline in function from baseline    Co-evaluation               AM-PAC PT "6 Clicks" Mobility  Outcome Measure Help needed turning from your back to your side while in a flat bed without using bedrails?: A Lot Help needed moving from lying on your back to sitting on the side of a flat bed without using bedrails?: A Lot Help needed moving to and from a bed to a chair (including a wheelchair)?: A Lot Help needed standing up from a chair using your arms (e.g., wheelchair or bedside chair)?: A Lot Help needed to walk in hospital room?: Total Help  needed climbing 3-5 steps with a railing? : Total 6 Click Score: 10    End of Session Equipment Utilized During Treatment: Oxygen;Gait belt Activity Tolerance: Treatment limited secondary to agitation;Treatment limited secondary to medical complications (Comment) Patient left: in chair;with call bell/phone within reach;with chair alarm set Nurse Communication: Mobility status;Other (comment)(notified pt had removed mitt and RR incidents) PT Visit Diagnosis: Other abnormalities of gait  and mobility (R26.89);Muscle weakness (generalized) (M62.81)    Time: 8299-3716 PT Time Calculation (min) (ACUTE ONLY): 31 min   Charges:   PT Evaluation $PT Eval Moderate Complexity: 1 Mod PT Treatments $Therapeutic Activity: 8-22 mins        Drema Pry, PT   Freddi Starr 05/15/2019, 4:00 PM

## 2019-05-16 ENCOUNTER — Inpatient Hospital Stay (HOSPITAL_COMMUNITY): Payer: Medicare Other

## 2019-05-16 DIAGNOSIS — R791 Abnormal coagulation profile: Secondary | ICD-10-CM

## 2019-05-16 LAB — GLUCOSE, CAPILLARY
Glucose-Capillary: 110 mg/dL — ABNORMAL HIGH (ref 70–99)
Glucose-Capillary: 115 mg/dL — ABNORMAL HIGH (ref 70–99)
Glucose-Capillary: 77 mg/dL (ref 70–99)
Glucose-Capillary: 90 mg/dL (ref 70–99)

## 2019-05-16 LAB — C-REACTIVE PROTEIN: CRP: 3 mg/dL — ABNORMAL HIGH (ref <1.0)

## 2019-05-16 LAB — CBC WITH DIFFERENTIAL/PLATELET
Abs Immature Granulocytes: 0.05 10*3/uL (ref 0.00–0.07)
Basophils Absolute: 0 10*3/uL (ref 0.0–0.1)
Basophils Relative: 0 %
Eosinophils Absolute: 0 10*3/uL (ref 0.0–0.5)
Eosinophils Relative: 0 %
HCT: 37.4 % (ref 36.0–46.0)
Hemoglobin: 11.9 g/dL — ABNORMAL LOW (ref 12.0–15.0)
Immature Granulocytes: 1 %
Lymphocytes Relative: 13 %
Lymphs Abs: 1 10*3/uL (ref 0.7–4.0)
MCH: 31.5 pg (ref 26.0–34.0)
MCHC: 31.8 g/dL (ref 30.0–36.0)
MCV: 98.9 fL (ref 80.0–100.0)
Monocytes Absolute: 0.5 10*3/uL (ref 0.1–1.0)
Monocytes Relative: 6 %
Neutro Abs: 6.8 10*3/uL (ref 1.7–7.7)
Neutrophils Relative %: 80 %
Platelets: 174 10*3/uL (ref 150–400)
RBC: 3.78 MIL/uL — ABNORMAL LOW (ref 3.87–5.11)
RDW: 18.4 % — ABNORMAL HIGH (ref 11.5–15.5)
WBC: 8.3 10*3/uL (ref 4.0–10.5)
nRBC: 1.8 % — ABNORMAL HIGH (ref 0.0–0.2)

## 2019-05-16 LAB — COMPREHENSIVE METABOLIC PANEL
ALT: 55 U/L — ABNORMAL HIGH (ref 0–44)
AST: 51 U/L — ABNORMAL HIGH (ref 15–41)
Albumin: 2.9 g/dL — ABNORMAL LOW (ref 3.5–5.0)
Alkaline Phosphatase: 159 U/L — ABNORMAL HIGH (ref 38–126)
Anion gap: 9 (ref 5–15)
BUN: 26 mg/dL — ABNORMAL HIGH (ref 8–23)
CO2: 22 mmol/L (ref 22–32)
Calcium: 8.6 mg/dL — ABNORMAL LOW (ref 8.9–10.3)
Chloride: 115 mmol/L — ABNORMAL HIGH (ref 98–111)
Creatinine, Ser: 0.76 mg/dL (ref 0.44–1.00)
GFR calc Af Amer: >60 mL/min (ref >60)
GFR calc non Af Amer: >60 mL/min (ref >60)
Glucose, Bld: 86 mg/dL (ref 70–99)
Potassium: 4.1 mmol/L (ref 3.5–5.1)
Sodium: 146 mmol/L — ABNORMAL HIGH (ref 135–145)
Total Bilirubin: 0.9 mg/dL (ref 0.3–1.2)
Total Protein: 6.4 g/dL — ABNORMAL LOW (ref 6.5–8.1)

## 2019-05-16 LAB — BRAIN NATRIURETIC PEPTIDE: B Natriuretic Peptide: 482.3 pg/mL — ABNORMAL HIGH (ref 0.0–100.0)

## 2019-05-16 LAB — PROCALCITONIN: Procalcitonin: 0.39 ng/mL

## 2019-05-16 LAB — D-DIMER, QUANTITATIVE: D-Dimer, Quant: 16.24 ug/mL-FEU — ABNORMAL HIGH (ref 0.00–0.50)

## 2019-05-16 LAB — MAGNESIUM: Magnesium: 2.4 mg/dL (ref 1.7–2.4)

## 2019-05-16 MED ORDER — DEXTROSE 5 % IV SOLN: INTRAVENOUS | Status: AC

## 2019-05-16 MED ORDER — DEXAMETHASONE SODIUM PHOSPHATE 4 MG/ML IJ SOLN
4.0000 mg | INTRAMUSCULAR | Status: DC
  Administered 2019-05-17: 15:00:00 4 mg via INTRAVENOUS
  Filled 2019-05-16: qty 1

## 2019-05-16 MED ORDER — DEXTROSE 5 % IV SOLN: INTRAVENOUS | Status: DC

## 2019-05-16 NOTE — Progress Notes (Signed)
Occupational Therapy Evaluation Patient Details Name: Adriana Fox MRN: [id number] DOB: October 26, 1945 Today's Date: 05/16/2019    History of Present Illness 74 year old with past medical history significant for dementia, hyperlipidemia, CVA with residual left-sided weakness, diagnosed with COVID-19 on 12/17.  Present with worsening shortness of breath, was diagnosed with acute hypoxic respiratory failure due to COVID-19 pneumonia and admitted to the hospital.   Clinical Impression   PTA, pt lived with grand daughter and appears that she was ambulatory and assisted with ADL tasks. Required Mod A to move to EOB. Stood briefly with Mod A, but could not ambulate at this time. Pt sat back onto bed. HR in 120s and SpO2 dest into 80s on 6L HFNC. Pt appear anxious and not able to follow commands at this time. It will be beneficial for pt to DC to familiar environment however, they will need to be able to assist with all mobility and ADL tasks. Will continue to follow acutely.     Follow Up Recommendations  Home health OT;Supervision/Assistance - 24 hour(HH Aide)    Equipment Recommendations  3 in 1 bedside commode    Recommendations for Other Services       Precautions / Restrictions Precautions Precautions: Fall      Mobility Bed Mobility Overal bed mobility: Needs Assistance Bed Mobility: Supine to Sit;Sit to Supine     Supine to sit: Mod assist Sit to supine: Mod assist   General bed mobility comments: howver pt sitting up on her own into long sitting  Transfers Overall transfer level: Needs assistance     Sit to Stand: Mod assist              Balance                                           ADL either performed or assessed with clinical judgement   ADL Overall ADL's : Needs assistance/impaired   Eating/Feeding Details (indicate cue type and reason): attempted to get pt to drink. Pt would hold drink then put back on the table Grooming: Maximal  assistance;Sitting   Upper Body Bathing: Maximal assistance;Bed level   Lower Body Bathing: Maximal assistance;Bed level   Upper Body Dressing : Maximal assistance;Sitting   Lower Body Dressing: Maximal assistance;Bed level               Functional mobility during ADLs: Moderate assistance General ADL Comments: Stood briefly but sat back on the bed     Vision         Perception     Praxis      Pertinent Vitals/Pain Pain Assessment: Faces Faces Pain Scale: Hurts little more Pain Location: general discomfort Pain Descriptors / Indicators: Guarding Pain Intervention(s): Limited activity within patient's tolerance     Hand Dominance     Extremity/Trunk Assessment Upper Extremity Assessment Upper Extremity Assessment: Generalized weakness   Lower Extremity Assessment Lower Extremity Assessment: Defer to PT evaluation   Cervical / Trunk Assessment Cervical / Trunk Assessment: Kyphotic   Communication Communication Communication: Expressive difficulties   Cognition Arousal/Alertness: Awake/alert Behavior During Therapy: Anxious;Restless Overall Cognitive Status: Impaired/Different from baseline Area of Impairment: Orientation;Attention;Memory;Following commands;Safety/judgement;Awareness;Problem solving                 Orientation Level: Disoriented to;Place;Time;Situation Current Attention Level: Sustained Memory: Decreased recall of precautions;Decreased short-term memory Following Commands: Follows one step commands inconsistently  Safety/Judgement: Decreased awareness of safety;Decreased awareness of deficits Awareness: Intellectual Problem Solving: Slow processing;Decreased initiation;Requires verbal cues;Difficulty sequencing;Requires tactile cues     General Comments       Exercises     Shoulder Instructions      Home Living Family/patient expects to be discharged to:: Private residence Living Arrangements: Children Available Help at  Discharge: Family Type of Home: Apartment Home Access: Stairs to enter Technical brewer of Steps: flight             Bathroom Toilet: Standard Bathroom Accessibility: No   Home Equipment: Kasandra Knudsen - single point   Additional Comments: information from daughter, family has come to visit when pt got sick, currently staying w/ pts granddaughter in [place]      Prior Functioning/Environment Level of Independence: Independent with assistive device(s)        Comments: pt was able to get aorund on her own with cane, able to get herself to bathroom etc with supervision, was able to ambulate long distances such as grocery store        OT Problem List: Decreased strength;Decreased activity tolerance;Impaired balance (sitting and/or standing);Decreased coordination;Decreased cognition;Decreased safety awareness;Decreased knowledge of use of DME or AE;Decreased knowledge of precautions;Cardiopulmonary status limiting activity;Pain      OT Treatment/Interventions: Self-care/ADL training;Therapeutic exercise;Neuromuscular education;Energy conservation;DME and/or AE instruction;Therapeutic activities;Cognitive remediation/compensation;Patient/family education;Balance training    OT Goals(Current goals can be found in the care plan section) Acute Rehab OT Goals Patient Stated Goal: daughter needs pt to be as close to baseline as possible to be able to go home again Time For Goal Achievement: 05/30/19 Potential to Achieve Goals: Good  OT Frequency: Min 2X/week   Barriers to D/C:            Co-evaluation              AM-PAC OT "6 Clicks" Daily Activity     Outcome Measure Help from another person eating meals?: A Lot Help from another person taking care of personal grooming?: A Lot Help from another person toileting, which includes using toliet, bedpan, or urinal?: Total Help from another person bathing (including washing, rinsing, drying)?: A Lot Help from another person to put  on and taking off regular upper body clothing?: A Lot Help from another person to put on and taking off regular lower body clothing?: Total 6 Click Score: 10   End of Session Equipment Utilized During Treatment: Oxygen(6L) Nurse Communication: Mobility status  Activity Tolerance: Patient tolerated treatment well Patient left: in bed;with call bell/phone within reach;with bed alarm set  OT Visit Diagnosis: Unsteadiness on feet (R26.81);Other abnormalities of gait and mobility (R26.89);Muscle weakness (generalized) (M62.81);Other symptoms and signs involving cognitive function;Pain Pain - part of body: (general)                Time: 3295-1884 OT Time Calculation (min): 22 min Charges:  OT General Charges $OT Visit: 1 Visit OT Evaluation $OT Eval Moderate Complexity: [place], OT/L   Acute OT Clinical Specialist Acute Rehabilitation Services Pager [phone] Office [phone]   [facility] 05/16/2019, 4:55 PM

## 2019-05-16 NOTE — Progress Notes (Signed)
PROGRESS NOTE                                                                                                                                                                                                             Patient Demographics:    Adriana Fox, is a 74 y.o. female, DOB - 10-18-1945, LDJ:[id number]  Outpatient Primary MD for the patient is Patient, No Pcp Per    LOS - 3  Admit date - 05/13/2019    Chief Complaint  Patient presents with   Hypotension   Weakness   Shortness of Breath       Brief Narrative 75 year old with past medical history significant for dementia, hyperlipidemia, CVA with residual left-sided weakness, diagnosed with COVID-19 on 12/17.  Present with worsening shortness of breath, was diagnosed with acute hypoxic respiratory failure due to COVID-19 pneumonia and admitted to the hospital.   Subjective:   Patient in bed, appears comfortable, denies any headache, no fever, no chest pain or pressure, no shortness of breath , no abdominal pain. No focal weakness.   Assessment  & Plan :    1. Acute Hypoxic Resp. Failure, Sepsis due to Acute Covid 19 Viral Pneumonitis during the ongoing 2020 Covid 19 Pandemic - she had moderate to severe disease along with sepsis and severe dehydration.  Sepsis pathophysiology has resolved, she has been treated with IV steroids and remdesivir with good effect.  Continue IV fluids for hydration.  Blood cultures 1 out of 2 showing contaminant coag negative staph.  Stopped all antibiotics.  Advance activity, titrate down steroids and oxygen as tolerated.  She is currently on 4 L nasal cannula oxygen down from 15.  Encouraged the patient to sit up in chair in the daytime use I-S and flutter valve for pulmonary toiletry and then prone in bed when at night.  SpO2: 90 % O2 Flow Rate (L/min): 4 L/min   Recent Labs  Lab 05/13/19 0420 05/13/19 0526 05/14/19 0418  05/15/19 0258 05/16/19 0654  CRP 4.3*  --  4.7* 3.5* 3.0*  DDIMER >20.00*  --  >20.00* >20.00* 16.24*  FERRITIN 336*  --  379* 303  --   BNP  --   --   --   --  482.3*  PROCALCITON 0.19  --  0.19 0.28 0.39  SARSCOV2NAA  --  POSITIVE*  --   --   --  Hepatic Function Latest Ref Rng & Units 05/16/2019 05/15/2019 05/14/2019  Total Protein 6.5 - 8.1 g/dL 6.4(L) 6.3(L) 6.6  Albumin 3.5 - 5.0 g/dL 2.9(L) 2.7(L) 2.6(L)  AST 15 - 41 U/L 51(H) 45(H) 57(H)  ALT 0 - 44 U/L 55(H) 49(H) 57(H)  Alk Phosphatase 38 - 126 U/L 159(H) 164(H) 156(H)  Total Bilirubin 0.3 - 1.2 mg/dL 0.9 0.6 1.1      2.  COVID-19 viral illness induced transaminitis.  Symptom-free, trend stable will monitor.  Is on statin monitor closely.  3.  Extremely elevated D-dimer.  CTA ruled out PE, lower extremity venous duplex, full dose Lovenox for now.  4.  Hyponatremia.  Due to dehydration.  Continue IV fluids, hyponatremia has improved.  5.  Dementia, CVA, left-sided hemiparesis.  Supportive care.  At risk for delirium, avoid benzodiazepines and narcotics.  Continue Plavix for secondary prevention along with statin.     Condition -   Guarded  Family Communication  : Daughter over the phone on 05/15/2019  Code Status : Full  Diet :   Diet Order            Diet Heart Room service appropriate? Yes with Assist; Fluid consistency: Thin  Diet effective now               Disposition Plan  : To be decided  Consults  : None  Procedures  :  CTA.  No PE.  Leg ultrasound.  PUD Prophylaxis : None  DVT Prophylaxis  :  Lovenox  High dose  Lab Results  Component Value Date   PLT 174 05/16/2019    Inpatient Medications  Scheduled Meds:  albuterol  2 puff Inhalation Q6H   vitamin C  500 mg Oral Daily   atorvastatin  40 mg Oral Daily   clopidogrel  75 mg Oral Daily   dexamethasone (DECADRON) injection  6 mg Intravenous Q24H   enoxaparin (LOVENOX) injection  50 mg Subcutaneous Q12H   famotidine  20 mg  Oral BID   memantine  5 mg Oral Daily   sodium chloride flush  3 mL Intravenous Q12H   zinc sulfate  220 mg Oral Daily   Continuous Infusions:  lactated ringers 75 mL/hr at 05/16/19 5956   remdesivir 100 mg in NS 100 mL 100 mg (05/16/19 1049)   PRN Meds:.guaiFENesin-dextromethorphan  Antibiotics  :    Anti-infectives (From admission, onward)   Start     Dose/Rate Route Frequency Ordered Stop   05/14/19 1500  vancomycin (VANCOCIN) IVPB 1000 mg/200 mL premix  Status:  Discontinued     1,000 mg 200 mL/hr over 60 Minutes Intravenous Every 24 hours 05/14/19 1354 05/15/19 1253   05/14/19 1000  remdesivir 100 mg in sodium chloride 0.9 % 100 mL IVPB     100 mg 200 mL/hr over 30 Minutes Intravenous Daily 05/13/19 1730 05/18/19 0959   05/13/19 1745  remdesivir 200 mg in sodium chloride 0.9% 250 mL IVPB     200 mg 580 mL/hr over 30 Minutes Intravenous Once 05/13/19 1730 05/13/19 2035       Time Spent in minutes  Kaka M.D on 05/16/2019 at 11:10 AM  To page go to [email or web address] - password [facility]  [facility] -  Office  [phone]     See all Orders from today for further details    Objective:   Vitals:   05/16/19 0336 05/16/19 0337 05/16/19 0356 05/16/19 0820  BP:   (!) 113/95 110/89  Pulse: (!) 102 90  96  Resp:   17 (!) 25  Temp:   98 F (36.7 C) 98.1 F (36.7 C)  TempSrc:   Axillary Axillary  SpO2: 95% 97%  92%  Weight:      Height:        Wt Readings from Last 3 Encounters:  05/13/19 50.1 kg     Intake/Output Summary (Last 24 hours) at 05/16/2019 1110 Last data filed at 05/15/2019 2100 Gross per 24 hour  Intake 40 ml  Output --  Net 40 ml     Physical Exam  Awake but pleasantly confused, appears to be in no distress and mild left-sided weakness which is chronic [place].AT,PERRAL Supple Neck,No JVD, No cervical lymphadenopathy appriciated.  Symmetrical Chest wall movement, Good air movement bilaterally, CTAB RRR,No Gallops, Rubs or new  Murmurs, No Parasternal Heave +ve B.Sounds, Abd Soft, No tenderness, No organomegaly appriciated, No rebound - guarding or rigidity. No Cyanosis, Clubbing or edema, No new Rash or bruise     Data Review:    CBC Recent Labs  Lab 05/13/19 0420 05/14/19 0418 05/15/19 0258 05/16/19 0654  WBC 9.6 10.9* 8.6 8.3  HGB 13.2 12.1 11.7* 11.9*  HCT 40.9 38.2 35.5* 37.4  PLT 275 234 184 174  MCV 95.1 97.0 96.2 98.9  MCH 30.7 30.7 31.7 31.5  MCHC 32.3 31.7 33.0 31.8  RDW 15.9* 17.0* 17.3* 18.4*  LYMPHSABS 1.9 1.8 1.3 1.0  MONOABS 0.6 0.6 0.6 0.5  EOSABS 0.1 0.0 0.0 0.0  BASOSABS 0.1 0.0 0.0 0.0    Chemistries  Recent Labs  Lab 05/13/19 0420 05/14/19 0418 05/15/19 0258 05/16/19 0654  NA 146* 148* 144 146*  K 3.1* 3.6 3.5 4.1  CL 110 115* 114* 115*  CO2 22 19* 21* 22  GLUCOSE 120* 111* 114* 86  BUN 29* 27* 28* 26*  CREATININE 1.00 0.84 0.83 0.76  CALCIUM 8.7* 8.2* 8.3* 8.6*  MG  --  2.3 2.4 2.4  AST 56* 57* 45* 51*  ALT 54* 57* 49* 55*  ALKPHOS 135* 156* 164* 159*  BILITOT 1.0 1.1 0.6 0.9   ------------------------------------------------------------------------------------------------------------------ No results for input(s): CHOL, HDL, LDLCALC, TRIG, CHOLHDL, LDLDIRECT in the last 72 hours.  No results found for: HGBA1C ------------------------------------------------------------------------------------------------------------------ No results for input(s): TSH, T4TOTAL, T3FREE, THYROIDAB in the last 72 hours.  Invalid input(s): FREET3  Cardiac Enzymes No results for input(s): CKMB, TROPONINI, MYOGLOBIN in the last 168 hours.  Invalid input(s): CK ------------------------------------------------------------------------------------------------------------------    Component Value Date/Time   BNP 482.3 (H) 05/16/2019 0654    Micro Results Recent Results (from the past 240 hour(s))  Blood Culture (routine x 2)     Status: Abnormal   Collection Time: 05/13/19   4:22 AM   Specimen: BLOOD  Result Value Ref Range Status   Specimen Description BLOOD LEFT ANTECUBITAL  Final   Special Requests   Final    BOTTLES DRAWN AEROBIC AND ANAEROBIC Blood Culture adequate volume   Culture  Setup Time   Final    IN BOTH AEROBIC AND ANAEROBIC BOTTLES GRAM POSITIVE COCCI CRITICAL RESULT CALLED TO, READ BACK BY AND VERIFIED WITH: J LEDFORD PHARMD 05/13/19 2240 JDW    Culture (A)  Final    STAPHYLOCOCCUS SPECIES (COAGULASE NEGATIVE) THE SIGNIFICANCE OF ISOLATING THIS ORGANISM FROM A SINGLE SET OF BLOOD CULTURES WHEN MULTIPLE SETS ARE DRAWN IS UNCERTAIN. PLEASE NOTIFY THE MICROBIOLOGY DEPARTMENT WITHIN ONE WEEK IF SPECIATION AND SENSITIVITIES ARE REQUIRED. Performed at [facility],  [address], [place], [place] [address]    Report Status 05/15/2019 FINAL  Final  Blood Culture (routine x 2)     Status: None (Preliminary result)   Collection Time: 05/13/19  4:22 AM   Specimen: BLOOD  Result Value Ref Range Status   Specimen Description BLOOD RIGHT ANTECUBITAL  Final   Special Requests   Final    BOTTLES DRAWN AEROBIC AND ANAEROBIC Blood Culture adequate volume   Culture   Final    NO GROWTH 3 DAYS Performed at [facility], [place] [address], [place], [place] [address]    Report Status PENDING  Incomplete  SARS CORONAVIRUS 2 (TAT 6-24 HRS) Nasopharyngeal Nasopharyngeal Swab     Status: Abnormal   Collection Time: 05/13/19  5:26 AM   Specimen: Nasopharyngeal Swab  Result Value Ref Range Status   SARS Coronavirus 2 POSITIVE (A) NEGATIVE Final    Comment: emailed L. Berdik RN 14:15 05/13/19 ([email or web address]) (NOTE) SARS-CoV-2 target nucleic acids are DETECTED. The SARS-CoV-2 RNA is generally detectable in upper and lower respiratory specimens during the acute phase of infection. Positive results are indicative of the presence of SARS-CoV-2 RNA. Clinical correlation with patient history and other diagnostic information is  necessary to determine patient  infection status. Positive results do not rule out bacterial infection or co-infection with other viruses.  The expected result is Negative. Fact Sheet for Patients: SugarRoll.be Fact Sheet for Healthcare Providers: https://www.woods-mathews.com/ This test is not yet approved or cleared by the [place] FDA and  has been authorized for detection and/or diagnosis of SARS-CoV-2 by FDA under an Emergency Use Authorization (EUA). This EUA will remain  in effect (meaning this test can be used) for the duration of the COVID-19 declaration un der Section 564(b)(1) of the Act, 21 U.S.C. section 360bbb-3(b)(1), unless the authorization is terminated or revoked sooner. Performed at [facility], [place] [address], [place], [place] [address]     Radiology Reports CT Angio Chest PE W and/or Wo Contrast  Result Date: 05/13/2019 CLINICAL DATA:  Show less of breath and positive D-dimer EXAM: CT ANGIOGRAPHY CHEST WITH CONTRAST TECHNIQUE: Multidetector CT imaging of the chest was performed using the standard protocol during bolus administration of intravenous contrast. Multiplanar CT image reconstructions and MIPs were obtained to evaluate the vascular anatomy. CONTRAST:  169m OMNIPAQUE IOHEXOL 350 MG/ML SOLN COMPARISON:  None. FINDINGS: Cardiovascular: Overall normal heart size but asymmetric dilatation of the right heart. Pulmonary artery visualization is degraded by motion artifact there is diagnostic opacification and visualization to the segmental level throughout the majority of the lungs. No filling defect is seen. There is streaming artifact in the left main pulmonary artery. Mild atherosclerotic calcification of the aorta. Mediastinum/Nodes: Paucity of mediastinal fat with no suspected adenopathy or mass. Lungs/Pleura: Subpleural opacity with coarse reticular appearance. Some of this may be chronic as there is suggestion of some honeycombing and traction  bronchiectasis. Dependent atelectasis. No edema, effusion, or pneumothorax. Upper Abdomen: Bilateral hepatic cystic densities. Early opacification of the aorta. Musculoskeletal: No acute or aggressive finding. Review of the MIP images confirms the above findings. IMPRESSION: 1. Interstitial opacities likely related to history of COVID 19. There may be underlying interstitial lung disease. 2. Probable right heart dilatation but no evidence of pulmonary embolism. Electronically Signed   By: JMonte FantasiaM.D.   On: 05/13/2019 08:26   DG Chest Port 1 View  Result Date: 05/13/2019 CLINICAL DATA:  Shortness of breath.  COVID-19 positive EXAM: PORTABLE  CHEST 1 VIEW COMPARISON:  04/07/2018 FINDINGS: Diffuse interstitial coarsening with patchy airspace type opacities at the left more than right base. No effusion or pneumothorax. Normal heart size and stable aortic contours. Artifact from EKG leads IMPRESSION: Patchy airspace and interstitial opacity correlating with history of COVID-19. Electronically Signed   By: Monte Fantasia M.D.   On: 05/13/2019 04:34

## 2019-05-16 NOTE — TOC Initial Note (Signed)
Transition of Care [facility]) - Initial/Assessment Note    Patient Details  Name: Adriana Fox MRN: [id number] Date of Birth: 12-28-1945  Transition of [facility] CM/SW Contact:    Alberteen Sam, [place] Phone Number: [phone] 05/16/2019, 9:20 AM  Clinical Narrative:                  CSW spoke with patient's daughter Noland Fordyce regarding home health recommendation for PT/OT, she is in agreement with this and asked CSW to send out referrals for home health agencies, as she haas no preference at this time. She did report patient has family support at home, and multiple family members live in the home with her. They are slightly concerned with her having COVID, however are hopeful of her not being contagious at time of discharge.   CSW sent referral to [facility] with Alvis Lemmings, he has accepted. Patient is set up with [facility] home health services at time of discharge.   Expected Discharge Plan: [place] Barriers to Discharge: Continued Medical Work up   Patient Goals and CMS Choice   CMS Medicare.gov Compare Post Acute Care list provided to:: Patient Represenative (must comment)(daughter Pina) Choice offered to / list presented to : Adult Children  Expected Discharge Plan and Services Expected Discharge Plan: [place]       Living arrangements for the past 2 months: [place] Arranged: PT, OT [place] Agency: [place] Date [facility] Agency Contacted: 05/16/19 Time HH Agency Contacted: [id number] Representative spoke with at [place]: [place] Arrangements/Services Living arrangements for the past 2 months: [place] Lives with:: Adult Children, Relatives Patient language and need for interpreter reviewed:: Yes Do you feel safe going back to the place where you live?: Yes      Need for Family Participation in Patient Care: Yes (Comment) Care giver support system in place?: Yes (comment)   Criminal  Activity/Legal Involvement Pertinent to Current Situation/Hospitalization: No - Comment as needed  Activities of Daily Living Home Assistive Devices/Equipment: Cane (specify quad or straight) ADL Screening (condition at time of admission) Patient's cognitive ability adequate to safely complete daily activities?: No Is the patient deaf or have difficulty hearing?: No Does the patient have difficulty seeing, even when wearing glasses/contacts?: No Does the patient have difficulty concentrating, remembering, or making decisions?: Yes Patient able to express need for assistance with ADLs?: No Does the patient have difficulty dressing or bathing?: Yes Independently performs ADLs?: No Dressing (OT): Needs assistance Grooming: Needs assistance Feeding: Needs assistance Bathing: Needs assistance Toileting: Needs assistance In/Out Bed: Needs assistance Does the patient have difficulty walking or climbing stairs?: Yes Weakness of Legs: Both Weakness of Arms/Hands: None  Permission Sought/Granted Permission sought to share information with : Case Manager, Customer service manager, Family Supports Permission granted to share information with : Yes, Verbal Permission Granted  Share Information with NAME: Pina  Permission granted to share info w AGENCY: [place] granted to share info w Relationship: daugher  Permission granted to share info w Contact Information: [phone]  Emotional Assessment Appearance:: Other (Comment Required(unable to assess - remote) Attitude/Demeanor/Rapport: Unable to Assess Affect (typically observed): Unable to Assess Orientation: : Oriented to Self Alcohol / Substance Use: Not Applicable Psych Involvement: No (comment)  Admission diagnosis:  Shortness  of breath [R06.02] Sepsis (HCC) [A41.9] COVID-19 [U07.1] Patient Active Problem List   Diagnosis Date Noted  . Sepsis (HCC) 05/13/2019  . Pneumonia due to COVID-19 virus  05/13/2019  . Hypokalemia 05/13/2019  . Dementia (HCC) 05/13/2019  . Prolonged QT interval 05/13/2019  . History of CVA with residual deficit 05/13/2019  . Dehydration 05/13/2019   PCP:  Patient, No Pcp Per Pharmacy:   [facility] [facility] [place], [place] - [address] [place] [address] Phone: [phone] Fax: [phone]     Social Determinants of Health (SDOH) Interventions    Readmission Risk Interventions No flowsheet data found.

## 2019-05-16 NOTE — Progress Notes (Signed)
Bilateral lower extremity venous duplex has been completed. Preliminary results can be found in CV Proc through chart review.   05/16/19 11:12 AM Olen Cordial RVT

## 2019-05-17 LAB — COMPREHENSIVE METABOLIC PANEL
ALT: 52 U/L — ABNORMAL HIGH (ref 0–44)
AST: 42 U/L — ABNORMAL HIGH (ref 15–41)
Albumin: 2.7 g/dL — ABNORMAL LOW (ref 3.5–5.0)
Alkaline Phosphatase: 142 U/L — ABNORMAL HIGH (ref 38–126)
Anion gap: 9 (ref 5–15)
BUN: 23 mg/dL (ref 8–23)
CO2: 19 mmol/L — ABNORMAL LOW (ref 22–32)
Calcium: 8.4 mg/dL — ABNORMAL LOW (ref 8.9–10.3)
Chloride: 114 mmol/L — ABNORMAL HIGH (ref 98–111)
Creatinine, Ser: 0.63 mg/dL (ref 0.44–1.00)
GFR calc Af Amer: >60 mL/min (ref >60)
GFR calc non Af Amer: >60 mL/min (ref >60)
Glucose, Bld: 109 mg/dL — ABNORMAL HIGH (ref 70–99)
Potassium: 3.8 mmol/L (ref 3.5–5.1)
Sodium: 142 mmol/L (ref 135–145)
Total Bilirubin: 0.5 mg/dL (ref 0.3–1.2)
Total Protein: 5.9 g/dL — ABNORMAL LOW (ref 6.5–8.1)

## 2019-05-17 LAB — CBC WITH DIFFERENTIAL/PLATELET
Abs Immature Granulocytes: 0.06 10*3/uL (ref 0.00–0.07)
Basophils Absolute: 0 10*3/uL (ref 0.0–0.1)
Basophils Relative: 0 %
Eosinophils Absolute: 0 10*3/uL (ref 0.0–0.5)
Eosinophils Relative: 0 %
HCT: 35.3 % — ABNORMAL LOW (ref 36.0–46.0)
Hemoglobin: 11.4 g/dL — ABNORMAL LOW (ref 12.0–15.0)
Immature Granulocytes: 1 %
Lymphocytes Relative: 11 %
Lymphs Abs: 1 10*3/uL (ref 0.7–4.0)
MCH: 31.6 pg (ref 26.0–34.0)
MCHC: 32.3 g/dL (ref 30.0–36.0)
MCV: 97.8 fL (ref 80.0–100.0)
Monocytes Absolute: 0.7 10*3/uL (ref 0.1–1.0)
Monocytes Relative: 8 %
Neutro Abs: 7.3 10*3/uL (ref 1.7–7.7)
Neutrophils Relative %: 80 %
Platelets: 164 10*3/uL (ref 150–400)
RBC: 3.61 MIL/uL — ABNORMAL LOW (ref 3.87–5.11)
RDW: 18.6 % — ABNORMAL HIGH (ref 11.5–15.5)
WBC: 9.1 10*3/uL (ref 4.0–10.5)
nRBC: 1 % — ABNORMAL HIGH (ref 0.0–0.2)

## 2019-05-17 LAB — MAGNESIUM: Magnesium: 2.1 mg/dL (ref 1.7–2.4)

## 2019-05-17 LAB — C-REACTIVE PROTEIN: CRP: 2 mg/dL — ABNORMAL HIGH (ref <1.0)

## 2019-05-17 LAB — GLUCOSE, CAPILLARY
Glucose-Capillary: 94 mg/dL (ref 70–99)
Glucose-Capillary: 103 mg/dL — ABNORMAL HIGH (ref 70–99)

## 2019-05-17 LAB — D-DIMER, QUANTITATIVE: D-Dimer, Quant: 13.34 ug/mL-FEU — ABNORMAL HIGH (ref 0.00–0.50)

## 2019-05-17 LAB — BRAIN NATRIURETIC PEPTIDE: B Natriuretic Peptide: 519.5 pg/mL — ABNORMAL HIGH (ref 0.0–100.0)

## 2019-05-17 MED ORDER — LACTATED RINGERS IV SOLN
INTRAVENOUS | Status: AC
  Administered 2019-05-17: 19:00:00 125 mL/h via INTRAVENOUS

## 2019-05-17 MED ORDER — DEXTROSE-NACL 5-0.45 % IV SOLN: INTRAVENOUS | Status: DC

## 2019-05-17 MED ORDER — DEXAMETHASONE SODIUM PHOSPHATE 4 MG/ML IJ SOLN
2.0000 mg | INTRAMUSCULAR | Status: DC
  Administered 2019-05-18 – 2019-05-19 (×2): 2 mg via INTRAVENOUS
  Filled 2019-05-17 (×2): qty 1

## 2019-05-17 MED ORDER — TRAZODONE HCL 50 MG PO TABS
25.0000 mg | ORAL_TABLET | Freq: Two times a day (BID) | ORAL | Status: DC
  Administered 2019-05-17 – 2019-05-24 (×14): 25 mg via ORAL
  Filled 2019-05-17 (×11): qty 1
  Filled 2019-05-17: qty 0.5
  Filled 2019-05-17 (×2): qty 1

## 2019-05-17 MED ORDER — SODIUM CHLORIDE 0.9 % IV SOLN
100.0000 mg | Freq: Every day | INTRAVENOUS | Status: AC
  Administered 2019-05-17: 100 mg via INTRAVENOUS
  Filled 2019-05-17: qty 20

## 2019-05-17 MED ORDER — ENOXAPARIN SODIUM 40 MG/0.4ML ~~LOC~~ SOLN
40.0000 mg | Freq: Two times a day (BID) | SUBCUTANEOUS | Status: DC
  Administered 2019-05-17 – 2019-05-18 (×2): 40 mg via SUBCUTANEOUS
  Filled 2019-05-17 (×2): qty 0.4

## 2019-05-17 NOTE — Plan of Care (Signed)
  Problem: Education: Goal: Knowledge of risk factors and measures for prevention of condition will improve Outcome: Progressing   Problem: Coping: Goal: Psychosocial and spiritual needs will be supported Outcome: Progressing   Problem: Respiratory: Goal: Will maintain a patent airway Outcome: Progressing Goal: Complications related to the disease process, condition or treatment will be avoided or minimized Outcome: Progressing   

## 2019-05-17 NOTE — Progress Notes (Signed)
Patient's daughter Annetta Maw called and updated.

## 2019-05-17 NOTE — Progress Notes (Signed)
PROGRESS NOTE                                                                                                                                                                                                             Patient Demographics:    Adriana Fox, is a 74 y.o. female, DOB - June 12, 1945, WLS:[id number]  Outpatient Primary MD for the patient is Patient, No Pcp Per    LOS - 4  Admit date - 05/13/2019    Chief Complaint  Patient presents with  . Hypotension  . Weakness  . Shortness of Breath       Brief Narrative 74 year old with past medical history significant for dementia, hyperlipidemia, CVA with residual left-sided weakness, diagnosed with COVID-19 on 12/17.  Present with worsening shortness of breath, was diagnosed with acute hypoxic respiratory failure due to COVID-19 pneumonia and admitted to the hospital.   Subjective:   Patient in bed in no distress, mildly confused but denies any headache chest or abdominal pain, no shortness of breath.   Assessment  & Plan :    1. Acute Hypoxic Resp. Failure, Sepsis due to Acute Covid 19 Viral Pneumonitis during the ongoing 2020 Covid 19 Pandemic - she had moderate to severe disease along with sepsis and severe dehydration.  Sepsis pathophysiology has resolved, she has been treated with IV steroids and remdesivir with good effect.  Continue IV fluids for hydration.  Blood cultures 1 out of 2 showing contaminant coag negative staph.  Stopped all antibiotics.  Advance activity, titrate down steroids and oxygen as tolerated.  She is currently on 4 L nasal cannula oxygen down from 15.  Encouraged the patient to sit up in chair in the daytime use I-S and flutter valve for pulmonary toiletry and then prone in bed when at night.  SpO2: 92 % O2 Flow Rate (L/min): 4 L/min   Recent Labs  Lab 05/13/19 0420 05/13/19 0526 05/14/19 0418 05/15/19 0258 05/16/19 0654  05/17/19 0057  CRP 4.3*  --  4.7* 3.5* 3.0* 2.0*  DDIMER >20.00*  --  >20.00* >20.00* 16.24* 13.34*  FERRITIN 336*  --  379* 303  --   --   BNP  --   --   --   --  482.3* 519.5*  PROCALCITON 0.19  --  0.19 0.28 0.39  --   SARSCOV2NAA  --  POSITIVE*  --   --   --   --     Hepatic Function Latest Ref Rng & Units 05/17/2019 05/16/2019 05/15/2019  Total Protein 6.5 - 8.1 g/dL 5.9(L) 6.4(L) 6.3(L)  Albumin 3.5 - 5.0 g/dL 2.7(L) 2.9(L) 2.7(L)  AST 15 - 41 U/L 42(H) 51(H) 45(H)  ALT 0 - 44 U/L 52(H) 55(H) 49(H)  Alk Phosphatase 38 - 126 U/L 142(H) 159(H) 164(H)  Total Bilirubin 0.3 - 1.2 mg/dL 0.5 0.9 0.6      2.  COVID-19 viral illness induced transaminitis.  Symptom-free, trend stable will monitor.  Is on statin monitor closely.  3.  Extremely elevated D-dimer.  CTA lungs and lower extremity and leg ultrasound unremarkable, D-dimer is now trending down on full dose Lovenox and will switch to high dose prophylactic Lovenox on 05/17/2019.  Continue to monitor D-dimer closely.  4.  Hypotension and poor oral intake.  Gentle IV fluid hydration on 05/17/2019 and monitor closely.  5.  Dementia, CVA, left-sided hemiparesis.  Supportive care.  At risk for delirium, avoid benzodiazepines and narcotics.  Continue Plavix for secondary prevention along with statin.     Condition -   Guarded  Family Communication  : Daughter over the phone on 05/15/2019  Code Status : Full  Diet :   Diet Order            Diet Heart Room service appropriate? Yes with Assist; Fluid consistency: Thin  Diet effective now               Disposition Plan  : To be decided  Consults  : None  Procedures  :  CTA.  No PE.  Leg ultrasound.  PUD Prophylaxis : None  DVT Prophylaxis  :  Lovenox  High dose  Lab Results  Component Value Date   PLT 164 05/17/2019    Inpatient Medications  Scheduled Meds: . albuterol  2 puff Inhalation Q6H  . vitamin C  500 mg Oral Daily  . atorvastatin  40 mg Oral Daily  .  clopidogrel  75 mg Oral Daily  . dexamethasone (DECADRON) injection  4 mg Intravenous Q24H  . enoxaparin (LOVENOX) injection  50 mg Subcutaneous Q12H  . famotidine  20 mg Oral BID  . memantine  5 mg Oral Daily  . sodium chloride flush  3 mL Intravenous Q12H  . zinc sulfate  220 mg Oral Daily   Continuous Infusions: . remdesivir 100 mg in NS 100 mL 100 mg (05/16/19 1049)   PRN Meds:.guaiFENesin-dextromethorphan  Antibiotics  :    Anti-infectives (From admission, onward)   Start     Dose/Rate Route Frequency Ordered Stop   05/14/19 1500  vancomycin (VANCOCIN) IVPB 1000 mg/200 mL premix  Status:  Discontinued     1,000 mg 200 mL/hr over 60 Minutes Intravenous Every 24 hours 05/14/19 1354 05/15/19 1253   05/14/19 1000  remdesivir 100 mg in sodium chloride 0.9 % 100 mL IVPB     100 mg 200 mL/hr over 30 Minutes Intravenous Daily 05/13/19 1730 05/18/19 0959   05/13/19 1745  remdesivir 200 mg in sodium chloride 0.9% 250 mL IVPB     200 mg 580 mL/hr over 30 Minutes Intravenous Once 05/13/19 1730 05/13/19 2035       Time Spent in minutes  30   Lala Lund M.D on 05/17/2019 at 11:00 AM  To page go to [email or web address] - password [facility]  [facility] -  Office  [phone]     See  all Orders from today for further details    Objective:   Vitals:   05/17/19 0020 05/17/19 0102 05/17/19 0520 05/17/19 0838  BP: (!) 150/115 116/77 111/71 118/81  Pulse: 100 98    Resp: 20 (!) '21 18 20  ' Temp: 98.4 F (36.9 C)  98 F (36.7 C) 97.6 F (36.4 C)  TempSrc: Oral  Oral Axillary  SpO2: 97% 97% 94% 92%  Weight:      Height:        Wt Readings from Last 3 Encounters:  05/13/19 50.1 kg     Intake/Output Summary (Last 24 hours) at 05/17/2019 1100 Last data filed at 05/17/2019 0610 Gross per 24 hour  Intake 565 ml  Output 30 ml  Net 535 ml     Physical Exam  Awake but pleasantly confused, appears to be in no distress and mild left-sided weakness which is  chronic [place].AT,PERRAL Supple Neck,No JVD, No cervical lymphadenopathy appriciated.  Symmetrical Chest wall movement, Good air movement bilaterally, CTAB RRR,No Gallops, Rubs or new Murmurs, No Parasternal Heave +ve B.Sounds, Abd Soft, No tenderness, No organomegaly appriciated, No rebound - guarding or rigidity. No Cyanosis, Clubbing or edema, No new Rash or bruise    Data Review:    CBC Recent Labs  Lab 05/13/19 0420 05/14/19 0418 05/15/19 0258 05/16/19 0654 05/17/19 0057  WBC 9.6 10.9* 8.6 8.3 9.1  HGB 13.2 12.1 11.7* 11.9* 11.4*  HCT 40.9 38.2 35.5* 37.4 35.3*  PLT 275 234 184 174 164  MCV 95.1 97.0 96.2 98.9 97.8  MCH 30.7 30.7 31.7 31.5 31.6  MCHC 32.3 31.7 33.0 31.8 32.3  RDW 15.9* 17.0* 17.3* 18.4* 18.6*  LYMPHSABS 1.9 1.8 1.3 1.0 1.0  MONOABS 0.6 0.6 0.6 0.5 0.7  EOSABS 0.1 0.0 0.0 0.0 0.0  BASOSABS 0.1 0.0 0.0 0.0 0.0    Chemistries  Recent Labs  Lab 05/13/19 0420 05/14/19 0418 05/15/19 0258 05/16/19 0654 05/17/19 0057  NA 146* 148* 144 146* 142  K 3.1* 3.6 3.5 4.1 3.8  CL 110 115* 114* 115* 114*  CO2 22 19* 21* 22 19*  GLUCOSE 120* 111* 114* 86 109*  BUN 29* 27* 28* 26* 23  CREATININE 1.00 0.84 0.83 0.76 0.63  CALCIUM 8.7* 8.2* 8.3* 8.6* 8.4*  MG  --  2.3 2.4 2.4 2.1  AST 56* 57* 45* 51* 42*  ALT 54* 57* 49* 55* 52*  ALKPHOS 135* 156* 164* 159* 142*  BILITOT 1.0 1.1 0.6 0.9 0.5   ------------------------------------------------------------------------------------------------------------------ No results for input(s): CHOL, HDL, LDLCALC, TRIG, CHOLHDL, LDLDIRECT in the last 72 hours.  No results found for: HGBA1C ------------------------------------------------------------------------------------------------------------------ No results for input(s): TSH, T4TOTAL, T3FREE, THYROIDAB in the last 72 hours.  Invalid input(s): FREET3  Cardiac Enzymes No results for input(s): CKMB, TROPONINI, MYOGLOBIN in the last 168 hours.  Invalid input(s):  CK ------------------------------------------------------------------------------------------------------------------    Component Value Date/Time   BNP 519.5 (H) 05/17/2019 0057    Micro Results Recent Results (from the past 240 hour(s))  Blood Culture (routine x 2)     Status: Abnormal   Collection Time: 05/13/19  4:22 AM   Specimen: BLOOD  Result Value Ref Range Status   Specimen Description BLOOD LEFT ANTECUBITAL  Final   Special Requests   Final    BOTTLES DRAWN AEROBIC AND ANAEROBIC Blood Culture adequate volume   Culture  Setup Time   Final    IN BOTH AEROBIC AND ANAEROBIC BOTTLES GRAM POSITIVE COCCI CRITICAL RESULT CALLED TO, READ BACK BY  AND VERIFIED WITH: J LEDFORD [facility] 05/13/19 2240 JDW    Culture (A)  Final    STAPHYLOCOCCUS SPECIES (COAGULASE NEGATIVE) THE SIGNIFICANCE OF ISOLATING THIS ORGANISM FROM A SINGLE SET OF BLOOD CULTURES WHEN MULTIPLE SETS ARE DRAWN IS UNCERTAIN. PLEASE NOTIFY THE MICROBIOLOGY DEPARTMENT WITHIN ONE WEEK IF SPECIATION AND SENSITIVITIES ARE REQUIRED. Performed at [facility], [place] [address], [place], [place] [address]    Report Status 05/15/2019 FINAL  Final  Blood Culture (routine x 2)     Status: None (Preliminary result)   Collection Time: 05/13/19  4:22 AM   Specimen: BLOOD  Result Value Ref Range Status   Specimen Description BLOOD RIGHT ANTECUBITAL  Final   Special Requests   Final    BOTTLES DRAWN AEROBIC AND ANAEROBIC Blood Culture adequate volume   Culture   Final    NO GROWTH 4 DAYS Performed at [facility], [place] [address], [place], [place] [address]    Report Status PENDING  Incomplete  SARS CORONAVIRUS 2 (TAT 6-24 HRS) Nasopharyngeal Nasopharyngeal Swab     Status: Abnormal   Collection Time: 05/13/19  5:26 AM   Specimen: Nasopharyngeal Swab  Result Value Ref Range Status   SARS Coronavirus 2 POSITIVE (A) NEGATIVE Final    Comment: emailed L. Berdik RN 14:15 05/13/19 ([email or web address]) (NOTE) SARS-CoV-2 target  nucleic acids are DETECTED. The SARS-CoV-2 RNA is generally detectable in upper and lower respiratory specimens during the acute phase of infection. Positive results are indicative of the presence of SARS-CoV-2 RNA. Clinical correlation with patient history and other diagnostic information is  necessary to determine patient infection status. Positive results do not rule out bacterial infection or co-infection with other viruses.  The expected result is Negative. Fact Sheet for Patients: SugarRoll.be Fact Sheet for Healthcare Providers: https://www.woods-mathews.com/ This test is not yet approved or cleared by the [place] FDA and  has been authorized for detection and/or diagnosis of SARS-CoV-2 by FDA under an Emergency Use Authorization (EUA). This EUA will remain  in effect (meaning this test can be used) for the duration of the COVID-19 declaration un der Section 564(b)(1) of the Act, 21 U.S.C. section 360bbb-3(b)(1), unless the authorization is terminated or revoked sooner. Performed at [facility], [place] [address], [place], [place] [address]     Radiology Reports CT Angio Chest PE W and/or Wo Contrast  Result Date: 05/13/2019 CLINICAL DATA:  Show less of breath and positive D-dimer EXAM: CT ANGIOGRAPHY CHEST WITH CONTRAST TECHNIQUE: Multidetector CT imaging of the chest was performed using the standard protocol during bolus administration of intravenous contrast. Multiplanar CT image reconstructions and MIPs were obtained to evaluate the vascular anatomy. CONTRAST:  110m OMNIPAQUE IOHEXOL 350 MG/ML SOLN COMPARISON:  None. FINDINGS: Cardiovascular: Overall normal heart size but asymmetric dilatation of the right heart. Pulmonary artery visualization is degraded by motion artifact there is diagnostic opacification and visualization to the segmental level throughout the majority of the lungs. No filling defect is seen. There is  streaming artifact in the left main pulmonary artery. Mild atherosclerotic calcification of the aorta. Mediastinum/Nodes: Paucity of mediastinal fat with no suspected adenopathy or mass. Lungs/Pleura: Subpleural opacity with coarse reticular appearance. Some of this may be chronic as there is suggestion of some honeycombing and traction bronchiectasis. Dependent atelectasis. No edema, effusion, or pneumothorax. Upper Abdomen: Bilateral hepatic cystic densities. Early opacification of the aorta. Musculoskeletal: No acute or aggressive finding. Review of the MIP images confirms the above findings. IMPRESSION: 1.  Interstitial opacities likely related to history of COVID 19. There may be underlying interstitial lung disease. 2. Probable right heart dilatation but no evidence of pulmonary embolism. Electronically Signed   By: Monte Fantasia M.D.   On: 05/13/2019 08:26   DG Chest Port 1 View  Result Date: 05/13/2019 CLINICAL DATA:  Shortness of breath.  COVID-19 positive EXAM: PORTABLE CHEST 1 VIEW COMPARISON:  04/07/2018 FINDINGS: Diffuse interstitial coarsening with patchy airspace type opacities at the left more than right base. No effusion or pneumothorax. Normal heart size and stable aortic contours. Artifact from EKG leads IMPRESSION: Patchy airspace and interstitial opacity correlating with history of COVID-19. Electronically Signed   By: Monte Fantasia M.D.   On: 05/13/2019 04:34   Leg [place]  Result Date: 05/16/2019  Lower Venous Study Indications: Elevated Ddimer.  Risk Factors: COVID 19 positive. Limitations: Poor ultrasound/tissue interface and patient immobility, patient positioning, poor patient cooperation. Comparison Study: No prior studies. Performing Technologist: Oliver Hum RVT  Examination Guidelines: A complete evaluation includes B-mode imaging, spectral Doppler, color Doppler, and power Doppler as needed of all accessible portions of each vessel. Bilateral testing is considered an  integral part of a complete examination. Limited examinations for reoccurring indications may be performed as noted.  +---------+---------------+---------+-----------+----------+--------------+ RIGHT    CompressibilityPhasicitySpontaneityPropertiesThrombus Aging +---------+---------------+---------+-----------+----------+--------------+ CFV      Full           Yes      Yes                                 +---------+---------------+---------+-----------+----------+--------------+ SFJ      Full                                                        +---------+---------------+---------+-----------+----------+--------------+ FV Prox  Full                                                        +---------+---------------+---------+-----------+----------+--------------+ FV Mid   Full                                                        +---------+---------------+---------+-----------+----------+--------------+ FV DistalFull                                                        +---------+---------------+---------+-----------+----------+--------------+ PFV      Full                                                        +---------+---------------+---------+-----------+----------+--------------+ POP      Full  Yes      Yes                                 +---------+---------------+---------+-----------+----------+--------------+ PTV      Full                                                        +---------+---------------+---------+-----------+----------+--------------+ PERO     Full                                                        +---------+---------------+---------+-----------+----------+--------------+   +---------+---------------+---------+-----------+----------+--------------+ LEFT     CompressibilityPhasicitySpontaneityPropertiesThrombus Aging +---------+---------------+---------+-----------+----------+--------------+ CFV       Full           Yes      Yes                                 +---------+---------------+---------+-----------+----------+--------------+ SFJ      Full                                                        +---------+---------------+---------+-----------+----------+--------------+ FV Prox  Full                                                        +---------+---------------+---------+-----------+----------+--------------+ FV Mid   Full                                                        +---------+---------------+---------+-----------+----------+--------------+ FV DistalFull                                                        +---------+---------------+---------+-----------+----------+--------------+ PFV      Full                                                        +---------+---------------+---------+-----------+----------+--------------+ POP      Full           Yes      Yes                                 +---------+---------------+---------+-----------+----------+--------------+ PTV  Full                                                        +---------+---------------+---------+-----------+----------+--------------+ PERO     Full                                                        +---------+---------------+---------+-----------+----------+--------------+     Summary: Right: There is no evidence of deep vein thrombosis in the lower extremity. However, portions of this examination were limited- see technologist comments above. No cystic structure found in the popliteal fossa. Left: There is no evidence of deep vein thrombosis in the lower extremity. However, portions of this examination were limited- see technologist comments above. No cystic structure found in the popliteal fossa.  *See table(s) above for measurements and observations. Electronically signed by Deitra Mayo MD on 05/16/2019 at 3:45:52 PM.    Final

## 2019-05-17 NOTE — Progress Notes (Signed)
ANTICOAGULATION CONSULT NOTE   Pharmacy Consult for Lovenox Indication: VTE prophylaxis  No Known Allergies  Patient Measurements: Height: 5\' 6"  (167.6 cm) Weight: 110 lb 7.2 oz (50.1 kg) IBW/kg (Calculated) : 59.3 Heparin Dosing Weight:  Vital Signs: Temp: 97.6 F (36.4 C) (01/03 0838) Temp Source: Axillary (01/03 0838) BP: 118/81 (01/03 0838) Pulse Rate: 98 (01/03 0102)  Labs: Recent Labs    05/15/19 0258 05/16/19 0654 05/17/19 0057  HGB 11.7* 11.9* 11.4*  HCT 35.5* 37.4 35.3*  PLT 184 174 164  CREATININE 0.83 0.76 0.63    Estimated Creatinine Clearance: 49.5 mL/min (by C-G formula based on SCr of 0.63 mg/dL).   Medications:  Scheduled:  . albuterol  2 puff Inhalation Q6H  . vitamin C  500 mg Oral Daily  . atorvastatin  40 mg Oral Daily  . clopidogrel  75 mg Oral Daily  . [START ON 05/18/2019] dexamethasone (DECADRON) injection  2 mg Intravenous Q24H  . enoxaparin (LOVENOX) injection  50 mg Subcutaneous Q12H  . famotidine  20 mg Oral BID  . memantine  5 mg Oral Daily  . sodium chloride flush  3 mL Intravenous Q12H  . zinc sulfate  220 mg Oral Daily   Infusions:  . lactated ringers      Assessment: PHARMACY CONSULT: Lovenox for VTE prophylaxis  Increased to treatment dose lovenox 1 mg/kg (50mg ) BID on 1/1 for suspected VTE.    D-dimer decreasing:  >20, 16.24, 13.34 12/30 CTA neg PE   1/2 Dopplers: No DVT on right or left, but exam was limited. CBC: Hgb low/stable at 11.4, Plt down to 164k SCr <1 Weight 50 kg, BMI 17.8  Goal of Therapy:  VTE prophylaxis Monitor platelets by anticoagulation protocol: Yes   Plan:  Lovenox 40 mg [place] q12h. Pharmacy will sign off notes, but continue to follow peripherally.  PharmD, BCPS Clinical pharmacist phone 7am- 5pm: [phone] 05/17/2019 11:31 AM

## 2019-05-18 ENCOUNTER — Inpatient Hospital Stay (HOSPITAL_COMMUNITY): Payer: Medicare Other

## 2019-05-18 LAB — CBC WITH DIFFERENTIAL/PLATELET
Abs Immature Granulocytes: 0.03 10*3/uL (ref 0.00–0.07)
Basophils Absolute: 0 10*3/uL (ref 0.0–0.1)
Basophils Relative: 0 %
Eosinophils Absolute: 0 10*3/uL (ref 0.0–0.5)
Eosinophils Relative: 0 %
HCT: 37.6 % (ref 36.0–46.0)
Hemoglobin: 12 g/dL (ref 12.0–15.0)
Immature Granulocytes: 0 %
Lymphocytes Relative: 20 %
Lymphs Abs: 1.5 10*3/uL (ref 0.7–4.0)
MCH: 31 pg (ref 26.0–34.0)
MCHC: 31.9 g/dL (ref 30.0–36.0)
MCV: 97.2 fL (ref 80.0–100.0)
Monocytes Absolute: 0.7 10*3/uL (ref 0.1–1.0)
Monocytes Relative: 9 %
Neutro Abs: 5.1 10*3/uL (ref 1.7–7.7)
Neutrophils Relative %: 71 %
Platelets: 197 10*3/uL (ref 150–400)
RBC: 3.87 MIL/uL (ref 3.87–5.11)
RDW: 19.3 % — ABNORMAL HIGH (ref 11.5–15.5)
WBC: 7.3 10*3/uL (ref 4.0–10.5)
nRBC: 0.5 % — ABNORMAL HIGH (ref 0.0–0.2)

## 2019-05-18 LAB — GLUCOSE, CAPILLARY
Glucose-Capillary: 96 mg/dL (ref 70–99)
Glucose-Capillary: 114 mg/dL — ABNORMAL HIGH (ref 70–99)
Glucose-Capillary: 104 mg/dL — ABNORMAL HIGH (ref 70–99)
Glucose-Capillary: 114 mg/dL — ABNORMAL HIGH (ref 70–99)
Glucose-Capillary: 83 mg/dL (ref 70–99)
Glucose-Capillary: 127 mg/dL — ABNORMAL HIGH (ref 70–99)

## 2019-05-18 LAB — COMPREHENSIVE METABOLIC PANEL
ALT: 43 U/L (ref 0–44)
AST: 29 U/L (ref 15–41)
Albumin: 2.9 g/dL — ABNORMAL LOW (ref 3.5–5.0)
Alkaline Phosphatase: 130 U/L — ABNORMAL HIGH (ref 38–126)
Anion gap: 11 (ref 5–15)
BUN: 17 mg/dL (ref 8–23)
CO2: 22 mmol/L (ref 22–32)
Calcium: 8.6 mg/dL — ABNORMAL LOW (ref 8.9–10.3)
Chloride: 112 mmol/L — ABNORMAL HIGH (ref 98–111)
Creatinine, Ser: 0.56 mg/dL (ref 0.44–1.00)
GFR calc Af Amer: >60 mL/min (ref >60)
GFR calc non Af Amer: >60 mL/min (ref >60)
Glucose, Bld: 70 mg/dL (ref 70–99)
Potassium: 3.8 mmol/L (ref 3.5–5.1)
Sodium: 145 mmol/L (ref 135–145)
Total Bilirubin: 1.1 mg/dL (ref 0.3–1.2)
Total Protein: 5.9 g/dL — ABNORMAL LOW (ref 6.5–8.1)

## 2019-05-18 LAB — CULTURE, BLOOD (ROUTINE X 2)
Culture: NO GROWTH
Special Requests: ADEQUATE

## 2019-05-18 LAB — D-DIMER, QUANTITATIVE: D-Dimer, Quant: 8.51 ug/mL-FEU — ABNORMAL HIGH (ref 0.00–0.50)

## 2019-05-18 LAB — BRAIN NATRIURETIC PEPTIDE: B Natriuretic Peptide: 413.5 pg/mL — ABNORMAL HIGH (ref 0.0–100.0)

## 2019-05-18 LAB — MAGNESIUM: Magnesium: 2.1 mg/dL (ref 1.7–2.4)

## 2019-05-18 LAB — C-REACTIVE PROTEIN: CRP: 3.2 mg/dL — ABNORMAL HIGH (ref <1.0)

## 2019-05-18 MED ORDER — ENOXAPARIN SODIUM 30 MG/0.3ML ~~LOC~~ SOLN
30.0000 mg | Freq: Two times a day (BID) | SUBCUTANEOUS | Status: DC
  Administered 2019-05-18 – 2019-05-24 (×12): 30 mg via SUBCUTANEOUS
  Filled 2019-05-18 (×12): qty 0.3

## 2019-05-18 MED ORDER — FUROSEMIDE 10 MG/ML IJ SOLN
20.0000 mg | Freq: Once | INTRAMUSCULAR | Status: AC
  Administered 2019-05-18: 20 mg via INTRAVENOUS
  Filled 2019-05-18: qty 2

## 2019-05-18 MED ORDER — FUROSEMIDE 10 MG/ML IJ SOLN: 40.0000 mg | Freq: Once | INTRAMUSCULAR | Status: DC

## 2019-05-18 MED ORDER — LORAZEPAM 2 MG/ML IJ SOLN
0.5000 mg | Freq: Once | INTRAMUSCULAR | Status: AC
  Administered 2019-05-18: 17:00:00 0.5 mg via INTRAVENOUS
  Filled 2019-05-18: qty 1

## 2019-05-18 NOTE — Progress Notes (Signed)
Physical Therapy Treatment Patient Details Name: Adriana Fox MRN: [id number] DOB: [birth year] Today's Date: 05/18/2019    History of Present Illness 74 year old with past medical history significant for dementia, hyperlipidemia, CVA with residual left-sided weakness, diagnosed with COVID-19 on 12/17.  Present with worsening shortness of breath, was diagnosed with acute hypoxic respiratory failure due to COVID-19 pneumonia and admitted to the hospital.    PT Comments    Attempted to see pt for mobility/gait, however, found her O2 sats to be 67% on 4 L O2 [place] when plugged into the monitor (via pedi earlobe probe), unsure of if this was accurate, I turned her O2 up to 10 L HFNC and got a pedi probe on her toe, nellcor probe on her finger (the one with most nail bed showing as her fingers are painted with polish), and the nellcor forehead probe to check accuracy/find the best spot to monitor her at this time.  With the increased O2, pt's sats came up to the low 80s on 10 L and the toe pedi probe matched the nellcor probe the best, so left this one connected to the monitor.  MD notified and he asked to transfer pt back to bed, and increase her O2 to 15L HFNC.  HOB elevated and  Pt HR and RR increased with transfer back to bed (or with any attempt at mobility HR 120s, RR 40s with (+) belly breathing during mobility until she recovers with rest ~5-8 mins recovery time.  PT will continue to follow acutely for safe mobility progression.  Recommend re-assessing with nellcor forehead probe in future visits.  Follow Up Recommendations  SNF     Equipment Recommendations  None recommended by PT    Recommendations for Other Services OT consult     Precautions / Restrictions Precautions Precautions: Fall;Other (comment) Precaution Comments: cognition, monitor O2 sats--nellcor probe in cabinet in room    Mobility  Bed Mobility Overal bed mobility: Needs Assistance Bed Mobility: Sit to Supine        Sit to supine: Mod assist   General bed mobility comments: Mod assist to help lift both legs back into bed  Transfers Overall transfer level: Needs assistance Equipment used: 2 person hand held assist Transfers: Sit to/from [employer] Transfers Sit to Stand: +2 physical assistance;Mod assist Stand pivot transfers: +2 physical assistance;Mod assist       General transfer comment: Two person mod assist to stand and pivot chair to bed,  Pt unable to initiate movement to command and had difficulty sitting down once turned.   Ambulation/Gait             General Gait Details: Unable to safely at this time.           Balance Overall balance assessment: Needs assistance Sitting-balance support: Feet supported;Bilateral upper extremity supported Sitting balance-Leahy Scale: Poor Sitting balance - Comments: needs min assist EOB for safety and balance   Standing balance support: Bilateral upper extremity supported Standing balance-Leahy Scale: Poor Standing balance comment: needs two person external assist in standing.                             Cognition Arousal/Alertness: Awake/alert Behavior During Therapy: Restless;Impulsive;Anxious Overall Cognitive Status: Impaired/Different from baseline Area of Impairment: Orientation;Attention;Memory;Following commands;Safety/judgement;Awareness;Problem solving                 Orientation Level: Person Current Attention Level: Focused Memory: Decreased recall of precautions;Decreased short-term memory Following  Commands: Follows one step commands inconsistently Safety/Judgement: Decreased awareness of safety;Decreased awareness of deficits Awareness: Intellectual Problem Solving: Slow processing;Decreased initiation;Difficulty sequencing;Requires verbal cues;Requires tactile cues General Comments: Oriented to self, baseline dementia      Exercises      General Comments General comments (skin  integrity, edema, etc.): Came into room, found pt in chair, monitor turned off (per RN d/c earlier today).  Pt on 4L, turned monitor back on and O2 sats in the 60s, confirmed with nellcor forhead probe, and O2 turned up from 4 L to 10 L.  multiple probes attached to toe, finger, ear, and nellcor forehead probe.  Forehead probe reading low 80s on 10 L.  HR 120s with attempts at mobility and post mobility RR in the 40s (+) belly breathing until pt recovers ~5-8 mins of recovery.  Messaged MD, positioned back in bed and O2 turned up to 15L per MD instructions.        Pertinent Vitals/Pain Pain Assessment: Faces Faces Pain Scale: No hurt           PT Goals (current goals can now be found in the care plan section) Acute Rehab PT Goals Patient Stated Goal: daughter needs pt to be as close to baseline as possible to be able to go home again Progress towards PT goals: Not progressing toward goals - comment(more O2 two person assist)    Frequency    Min 3X/week      PT Plan Discharge plan needs to be updated       AM-PAC PT "6 Clicks" Mobility   Outcome Measure  Help needed turning from your back to your side while in a flat bed without using bedrails?: A Lot Help needed moving from lying on your back to sitting on the side of a flat bed without using bedrails?: A Lot Help needed moving to and from a bed to a chair (including a wheelchair)?: A Lot Help needed standing up from a chair using your arms (e.g., wheelchair or bedside chair)?: A Lot Help needed to walk in hospital room?: Total Help needed climbing 3-5 steps with a railing? : Total 6 Click Score: 10    End of Session Equipment Utilized During Treatment: Oxygen Activity Tolerance: Patient limited by fatigue;Other (comment)(limited by confusion and low sats) Patient left: in bed;with call bell/phone within reach;with bed alarm set Nurse Communication: Mobility status;Other (comment)(O2 sats) PT Visit Diagnosis: Other  abnormalities of gait and mobility (R26.89);Muscle weakness (generalized) (M62.81)     Time: 1400-1450 PT Time Calculation (min) (ACUTE ONLY): 50 min  Charges:  $Therapeutic Activity: 38-52 mins                    Corinna Capra, PT, DPT  Acute Rehabilitation [phone] pager #[phone] office  @ [facility]: [phone]

## 2019-05-18 NOTE — Plan of Care (Signed)
  Problem: Education: Goal: Knowledge of risk factors and measures for prevention of condition will improve Outcome: Progressing   Problem: Coping: Goal: Psychosocial and spiritual needs will be supported Outcome: Progressing   Problem: Respiratory: Goal: Will maintain a patent airway Outcome: Progressing Goal: Complications related to the disease process, condition or treatment will be avoided or minimized Outcome: Progressing   

## 2019-05-18 NOTE — Progress Notes (Signed)
ANTICOAGULATION CONSULT NOTE   Pharmacy Consult for Lovenox Indication: VTE prophylaxis  No Known Allergies  Patient Measurements: Height: 5\' 6"  (167.6 cm) Weight: 110 lb 7.2 oz (50.1 kg) IBW/kg (Calculated) : 59.3 Heparin Dosing Weight:  Vital Signs: Temp: 98.3 F (36.8 C) (01/04 0752) Temp Source: Axillary (01/04 0752) BP: 106/79 (01/04 0752) Pulse Rate: 93 (01/04 0355)  Labs: Recent Labs    05/16/19 0654 05/17/19 0057 05/18/19 0530  HGB 11.9* 11.4* 12.0  HCT 37.4 35.3* 37.6  PLT 174 164 197  CREATININE 0.76 0.63 0.56    Estimated Creatinine Clearance: 49.5 mL/min (by C-G formula based on SCr of 0.56 mg/dL).   Medications:  Scheduled:  . albuterol  2 puff Inhalation Q6H  . vitamin C  500 mg Oral Daily  . atorvastatin  40 mg Oral Daily  . clopidogrel  75 mg Oral Daily  . dexamethasone (DECADRON) injection  2 mg Intravenous Q24H  . enoxaparin (LOVENOX) injection  30 mg Subcutaneous Q12H  . famotidine  20 mg Oral BID  . LORazepam  0.5 mg Intravenous Once  . memantine  5 mg Oral Daily  . sodium chloride flush  3 mL Intravenous Q12H  . traZODone  25 mg Oral BID  . zinc sulfate  220 mg Oral Daily   Infusions:    Assessment: PHARMACY CONSULT: Lovenox for VTE prophylaxis  Increased to treatment dose lovenox 1 mg/kg (50mg ) BID on 1/1 for suspected VTE.    D-dimer decreasing:  >20>16.24>13.34>8.51 12/30 CTA neg PE   1/2 Dopplers: No DVT on right or left, but exam was limited. CBC: Hgb low/stable at 11.4, Plt down to 164k SCr <1 Weight 50 kg, BMI 17.8  Due to her wt, we will reduced the current lovenox 40mg  BID to 30mg  BID  Goal of Therapy:  VTE prophylaxis Monitor platelets by anticoagulation protocol: Yes   Plan:  Change Lovenox 30mg  SQ BID Pharmacy will sign off notes, but continue to follow peripherally.  , PharmD, BCIDP, AAHIVP, CPP Infectious Disease Pharmacist 05/18/2019 2:56 PM

## 2019-05-18 NOTE — Evaluation (Signed)
Physical Therapy Treatment Patient Details Name: Adriana Fox MRN: [id number] DOB: 06-30-1945 Today's Date: 05/18/2019    History of Present Illness 74 year old with past medical history significant for dementia, hyperlipidemia, CVA with residual left-sided weakness, diagnosed with COVID-19 on 12/17.  Present with worsening shortness of breath, was diagnosed with acute hypoxic respiratory failure due to COVID-19 pneumonia and admitted to the hospital.    PT Comments    She acknowledged name. Confused. Resistent to change of position and any LE ex format AA with cues. She is generally weak. Skin - poor tone and turgor. Monitor skin and joint integrity closely. Should benefit from PT to further address goals as detailed by primary PT, with focus on strengthening, transfers, and gait safety to reduce risk of falls.    Follow Up Recommendations  SNF     Equipment Recommendations  None recommended by PT    Recommendations for Other Services OT consult     Precautions / Restrictions Precautions Precautions: Fall Precaution Comments: cognition, monitor O2 sats--nellcor probe in cabinet in room Restrictions Weight Bearing Restrictions: No    Mobility  Bed Mobility Overal bed mobility: Needs Assistance Bed Mobility: Sit to Supine     Supine to sit: (refused to attempt supine to sit-very fearful) Sit to supine: Mod assist   General bed mobility comments: Mod assist to help lift both legs back into bed  Transfers Overall transfer level: Needs assistance Equipment used: 2 person hand held assist Transfers: Sit to/from [employer] Transfers Sit to Stand: +2 physical assistance;Mod assist Stand pivot transfers: +2 physical assistance;Mod assist       General transfer comment: She resisted all efforts to change position from supine to sit- rigidity noted throughout CORE and LEs when trying to assist with ex in LEs.  Ambulation/Gait             General Gait Details:  Unable to safely at this time.    Stairs             Wheelchair Mobility    Modified Rankin (Stroke Patients Only)       Balance Overall balance assessment: Needs assistance Sitting-balance support: Feet supported;Bilateral upper extremity supported Sitting balance-Leahy Scale: Poor Sitting balance - Comments: needs min assist EOB for safety and balance   Standing balance support: Bilateral upper extremity supported Standing balance-Leahy Scale: Poor Standing balance comment: needs two person external assist in standing.                             Cognition Arousal/Alertness: Awake/alert Behavior During Therapy: Restless;Impulsive;Anxious Overall Cognitive Status: Impaired/Different from baseline Area of Impairment: Orientation;Attention;Memory;Following commands;Safety/judgement;Awareness;Problem solving                 Orientation Level: Person Current Attention Level: Focused Memory: Decreased recall of precautions;Decreased short-term memory Following Commands: Follows one step commands inconsistently Safety/Judgement: Decreased awareness of safety;Decreased awareness of deficits Awareness: Intellectual Problem Solving: Slow processing;Decreased initiation;Difficulty sequencing;Requires verbal cues;Requires tactile cues General Comments: Oriented to self, baseline dementia      Exercises General Exercises - Lower Extremity Ankle Circles/Pumps: AAROM;Supine Short Arc Quad: AAROM;Supine Heel Slides: AAROM;Supine Hip ABduction/ADduction: AAROM;Supine Other Exercises Other Exercises: Did review IS and Flutter- with IS, able to achieve @ 300 for 5 out of 5 attempts. Presents as very anxious    General Comments General comments (skin integrity, edema, etc.): Came into room, found pt in chair, monitor turned off (per RN d/c earlier today).  Pt on 4L, turned monitor back on and O2 sats in the 60s, confirmed with nellcor forhead probe, and O2  turned up from 4 L to 10 L.  multiple probes attached to toe, finger, ear, and nellcor forehead probe.  Forehead probe reading low 80s on 10 L.  HR 120s with attempts at mobility and post mobility RR in the 40s (+) belly breathing until pt recovers ~5-8 mins of recovery.  Messaged MD, positioned back in bed and O2 turned up to 15L per MD instructions.        Pertinent Vitals/Pain Pain Assessment: No/denies pain Faces Pain Scale: No hurt Pain Descriptors / Indicators: Guarding(But guarding against changing positions. States "no pain")    Home Living Family/patient expects to be discharged to:: Private residence Living Arrangements: Children Available Help at Discharge: Family Type of Home: Apartment Home Access: Stairs to enter       Additional Comments: information from daughter, family has come to visit when pt got sick, currently staying w/ pts granddaughter in     Prior Function Level of Independence: Independent with assistive device(s)      Comments: pt was able to get aorund on her own with cane, able to get herself to bathroom etc with supervision, was able to ambulate long distances such as grocery store   PT Goals (current goals can now be found in the care plan section) Acute Rehab PT Goals Patient Stated Goal: daughter needs pt to be as close to baseline as possible to be able to go home again PT Goal Formulation: With patient/family Time For Goal Achievement: 05/29/19 Potential to Achieve Goals: Fair Progress towards PT goals: Not progressing toward goals - comment(more O2 two person assist)    Frequency    Min 3X/week      PT Plan Discharge plan needs to be updated    Co-evaluation              AM-PAC PT "6 Clicks" Mobility   Outcome Measure  Help needed turning from your back to your side while in a flat bed without using bedrails?: A Lot Help needed moving from lying on your back to sitting on the side of a flat bed without using bedrails?: A  Lot Help needed moving to and from a bed to a chair (including a wheelchair)?: A Lot Help needed standing up from a chair using your arms (e.g., wheelchair or bedside chair)?: A Lot Help needed to walk in hospital room?: A Lot Help needed climbing 3-5 steps with a railing? : A Lot 6 Click Score: 12    End of Session Equipment Utilized During Treatment: Oxygen Activity Tolerance: Patient limited by fatigue(Confused) Patient left: in bed;with call bell/phone within reach;with bed alarm set Nurse Communication: Mobility status;Other (comment)(O2 sats) PT Visit Diagnosis: Other abnormalities of gait and mobility (R26.89);Muscle weakness (generalized) (M62.81)     Time: 1962-2297 PT Time Calculation (min) (ACUTE ONLY): 30 min  Charges:  $Therapeutic Exercise: 8-22 mins $Therapeutic Activity: 8-22 mins                     Harriett Rush, PT # [phone] CGV cell    Ephriam Jenkins 05/18/2019, 4:38 PM

## 2019-05-18 NOTE — Progress Notes (Addendum)
PROGRESS NOTE                                                                                                                                                                                                             Patient Demographics:    Adriana Fox, is a 74 y.o. female, DOB - 1946/03/05, ACZ:[id number]  Outpatient Primary MD for the patient is Patient, No Pcp Per    LOS - 5  Admit date - 05/13/2019    Chief Complaint  Patient presents with  . Hypotension  . Weakness  . Shortness of Breath       Brief Narrative 74 year old with past medical history significant for dementia, hyperlipidemia, CVA with residual left-sided weakness, diagnosed with COVID-19 on 12/17.  Present with worsening shortness of breath, was diagnosed with acute hypoxic respiratory failure due to COVID-19 pneumonia and admitted to the hospital.   Subjective:   Patient in bed, appears comfortable, is somewhat sleepy but denies any headache or chest pain.  No shortness of breath.   Assessment  & Plan :    1. Acute Hypoxic Resp. Failure, Sepsis due to Acute Covid 19 Viral Pneumonitis during the ongoing 2020 Covid 19 Pandemic - she had moderate to severe disease along with sepsis and severe dehydration.  Sepsis pathophysiology has resolved, she has been treated with IV steroids and remdesivir with good effect.  Continue IV fluids for hydration.  Blood cultures 1 out of 2 showing contaminant coag negative staph.  Stopped all antibiotics.  Advance activity, titrate down steroids and oxygen as tolerated.  Oxygen is being titrated down currently on 3 to 4 L, was on 15 L initially.  Encouraged the patient to sit up in chair in the daytime use I-S and flutter valve for pulmonary toiletry and then prone in bed when at night.  SpO2: 95 % O2 Flow Rate (L/min): 4 L/min   Recent Labs  Lab 05/13/19 0420 05/13/19 0526 05/14/19 0418 05/15/19 0258  05/16/19 0654 05/17/19 0057 05/18/19 0530  CRP 4.3*  --  4.7* 3.5* 3.0* 2.0* 3.2*  DDIMER >20.00*  --  >20.00* >20.00* 16.24* 13.34* 8.51*  FERRITIN 336*  --  379* 303  --   --   --   BNP  --   --   --   --  482.3* 519.5* 413.5*  PROCALCITON  0.19  --  0.19 0.28 0.39  --   --   SARSCOV2NAA  --  POSITIVE*  --   --   --   --   --     Hepatic Function Latest Ref Rng & Units 05/18/2019 05/17/2019 05/16/2019  Total Protein 6.5 - 8.1 g/dL 5.9(L) 5.9(L) 6.4(L)  Albumin 3.5 - 5.0 g/dL 2.9(L) 2.7(L) 2.9(L)  AST 15 - 41 U/L 29 42(H) 51(H)  ALT 0 - 44 U/L 43 52(H) 55(H)  Alk Phosphatase 38 - 126 U/L 130(H) 142(H) 159(H)  Total Bilirubin 0.3 - 1.2 mg/dL 1.1 0.5 0.9      2.  COVID-19 viral illness induced transaminitis.  Symptom-free, trend stable will monitor.  Is on statin monitor closely.  3.  Extremely elevated D-dimer.  CTA lungs and lower extremity and leg ultrasound unremarkable, D-dimer is now trending down on full dose Lovenox and will switch to high dose prophylactic Lovenox on 05/17/2019.  Continue to monitor D-dimer closely.  4.  Hypotension and poor oral intake.  Gentle IV fluid hydration on 05/17/2019 and monitor closely.  5.  Dementia, CVA, left-sided hemiparesis.  Supportive care.  At risk for delirium, avoid benzodiazepines and narcotics.  Continue Plavix for secondary prevention along with statin.  6.  Toxic and metabolic encephalopathy due to COVID-19 infection in the setting of dementia and CVA.  Per daughter she uses trazodone twice daily to reduce her confusion and anxiety, she has been placed on home dose trazodone, will also add one-time dose of Ativan as she seems to get extremely anxious on movement on 05/18/19.    Condition -   Fair  Family Communication  : Daughter over the phone on 05/15/2019  Code Status : Full  Diet :   Diet Order            Diet Heart Room service appropriate? Yes with Assist; Fluid consistency: Thin  Diet effective now               Disposition  Plan  : To be decided  Consults  : None  Procedures  :  CTA.  No PE.  Leg ultrasound.  PUD Prophylaxis : None  DVT Prophylaxis  :  Lovenox  High dose  Lab Results  Component Value Date   PLT 197 05/18/2019    Inpatient Medications  Scheduled Meds: . albuterol  2 puff Inhalation Q6H  . vitamin C  500 mg Oral Daily  . atorvastatin  40 mg Oral Daily  . clopidogrel  75 mg Oral Daily  . dexamethasone (DECADRON) injection  2 mg Intravenous Q24H  . enoxaparin (LOVENOX) injection  40 mg Subcutaneous Q12H  . famotidine  20 mg Oral BID  . memantine  5 mg Oral Daily  . sodium chloride flush  3 mL Intravenous Q12H  . traZODone  25 mg Oral BID  . zinc sulfate  220 mg Oral Daily   Continuous Infusions:  PRN Meds:.guaiFENesin-dextromethorphan  Antibiotics  :    Anti-infectives (From admission, onward)   Start     Dose/Rate Route Frequency Ordered Stop   05/17/19 1600  remdesivir 100 mg in sodium chloride 0.9 % 100 mL IVPB     100 mg 200 mL/hr over 30 Minutes Intravenous Daily 05/17/19 1558 05/17/19 1716   05/14/19 1500  vancomycin (VANCOCIN) IVPB 1000 mg/200 mL premix  Status:  Discontinued     1,000 mg 200 mL/hr over 60 Minutes Intravenous Every 24 hours 05/14/19 1354 05/15/19 1253   05/14/19  1000  remdesivir 100 mg in sodium chloride 0.9 % 100 mL IVPB  Status:  Discontinued     100 mg 200 mL/hr over 30 Minutes Intravenous Daily 05/13/19 1730 05/17/19 1102   05/13/19 1745  remdesivir 200 mg in sodium chloride 0.9% 250 mL IVPB     200 mg 580 mL/hr over 30 Minutes Intravenous Once 05/13/19 1730 05/13/19 2035       Time Spent in minutes  30   Lala Lund M.D on 05/18/2019 at 11:14 AM  To page go to [email or web address] - password [facility]  [facility] -  Office  [phone]     See all Orders from today for further details    Objective:   Vitals:   05/17/19 2219 05/18/19 0000 05/18/19 0355 05/18/19 0752  BP: 117/84 114/83 128/90 106/79  Pulse: 92 83 93    Resp: (!) 21 (!) 22 (!) 23 20  Temp:  98.3 F (36.8 C) 98.3 F (36.8 C) 98.3 F (36.8 C)  TempSrc:  Axillary Oral Axillary  SpO2: 100% 97% 97% 95%  Weight:      Height:        Wt Readings from Last 3 Encounters:  05/13/19 50.1 kg     Intake/Output Summary (Last 24 hours) at 05/18/2019 1114 Last data filed at 05/18/2019 0855 Gross per 24 hour  Intake 120 ml  Output 850 ml  Net -730 ml     Physical Exam  Awake but pleasantly confused, appears to be in no distress and mild left-sided weakness which is chronic [place].AT,PERRAL Supple Neck,No JVD, No cervical lymphadenopathy appriciated.  Symmetrical Chest wall movement, Good air movement bilaterally, CTAB RRR,No Gallops, Rubs or new Murmurs, No Parasternal Heave +ve B.Sounds, Abd Soft, No tenderness, No organomegaly appriciated, No rebound - guarding or rigidity. No Cyanosis, Clubbing or edema, No new Rash or bruise    Data Review:    CBC Recent Labs  Lab 05/14/19 0418 05/15/19 0258 05/16/19 0654 05/17/19 0057 05/18/19 0530  WBC 10.9* 8.6 8.3 9.1 7.3  HGB 12.1 11.7* 11.9* 11.4* 12.0  HCT 38.2 35.5* 37.4 35.3* 37.6  PLT 234 184 174 164 197  MCV 97.0 96.2 98.9 97.8 97.2  MCH 30.7 31.7 31.5 31.6 31.0  MCHC 31.7 33.0 31.8 32.3 31.9  RDW 17.0* 17.3* 18.4* 18.6* 19.3*  LYMPHSABS 1.8 1.3 1.0 1.0 1.5  MONOABS 0.6 0.6 0.5 0.7 0.7  EOSABS 0.0 0.0 0.0 0.0 0.0  BASOSABS 0.0 0.0 0.0 0.0 0.0    Chemistries  Recent Labs  Lab 05/14/19 0418 05/15/19 0258 05/16/19 0654 05/17/19 0057 05/18/19 0530  NA 148* 144 146* 142 145  K 3.6 3.5 4.1 3.8 3.8  CL 115* 114* 115* 114* 112*  CO2 19* 21* 22 19* 22  GLUCOSE 111* 114* 86 109* 70  BUN 27* 28* 26* 23 17  CREATININE 0.84 0.83 0.76 0.63 0.56  CALCIUM 8.2* 8.3* 8.6* 8.4* 8.6*  MG 2.3 2.4 2.4 2.1 2.1  AST 57* 45* 51* 42* 29  ALT 57* 49* 55* 52* 43  ALKPHOS 156* 164* 159* 142* 130*  BILITOT 1.1 0.6 0.9 0.5 1.1    ------------------------------------------------------------------------------------------------------------------ No results for input(s): CHOL, HDL, LDLCALC, TRIG, CHOLHDL, LDLDIRECT in the last 72 hours.  No results found for: HGBA1C ------------------------------------------------------------------------------------------------------------------ No results for input(s): TSH, T4TOTAL, T3FREE, THYROIDAB in the last 72 hours.  Invalid input(s): FREET3  Cardiac Enzymes No results for input(s): CKMB, TROPONINI, MYOGLOBIN in the last 168 hours.  Invalid input(s): CK ------------------------------------------------------------------------------------------------------------------  Component Value Date/Time   BNP 413.5 (H) 05/18/2019 0530    Micro Results Recent Results (from the past 240 hour(s))  Blood Culture (routine x 2)     Status: Abnormal   Collection Time: 05/13/19  4:22 AM   Specimen: BLOOD  Result Value Ref Range Status   Specimen Description BLOOD LEFT ANTECUBITAL  Final   Special Requests   Final    BOTTLES DRAWN AEROBIC AND ANAEROBIC Blood Culture adequate volume   Culture  Setup Time   Final    IN BOTH AEROBIC AND ANAEROBIC BOTTLES GRAM POSITIVE COCCI CRITICAL RESULT CALLED TO, READ BACK BY AND VERIFIED WITH: J LEDFORD PHARMD 05/13/19 2240 JDW    Culture (A)  Final    STAPHYLOCOCCUS SPECIES (COAGULASE NEGATIVE) THE SIGNIFICANCE OF ISOLATING THIS ORGANISM FROM A SINGLE SET OF BLOOD CULTURES WHEN MULTIPLE SETS ARE DRAWN IS UNCERTAIN. PLEASE NOTIFY THE MICROBIOLOGY DEPARTMENT WITHIN ONE WEEK IF SPECIATION AND SENSITIVITIES ARE REQUIRED. Performed at [facility], [place] [address], [place], [place] [address]    Report Status 05/15/2019 FINAL  Final  Blood Culture (routine x 2)     Status: None (Preliminary result)   Collection Time: 05/13/19  4:22 AM   Specimen: BLOOD  Result Value Ref Range Status   Specimen Description BLOOD RIGHT ANTECUBITAL  Final    Special Requests   Final    BOTTLES DRAWN AEROBIC AND ANAEROBIC Blood Culture adequate volume   Culture   Final    NO GROWTH 4 DAYS Performed at [facility], [place] [address], [place], [place] [address]    Report Status PENDING  Incomplete  SARS CORONAVIRUS 2 (TAT 6-24 HRS) Nasopharyngeal Nasopharyngeal Swab     Status: Abnormal   Collection Time: 05/13/19  5:26 AM   Specimen: Nasopharyngeal Swab  Result Value Ref Range Status   SARS Coronavirus 2 POSITIVE (A) NEGATIVE Final    Comment: emailed L. Berdik RN 14:15 05/13/19 ([email or web address]) (NOTE) SARS-CoV-2 target nucleic acids are DETECTED. The SARS-CoV-2 RNA is generally detectable in upper and lower respiratory specimens during the acute phase of infection. Positive results are indicative of the presence of SARS-CoV-2 RNA. Clinical correlation with patient history and other diagnostic information is  necessary to determine patient infection status. Positive results do not rule out bacterial infection or co-infection with other viruses.  The expected result is Negative. Fact Sheet for Patients: SugarRoll.be Fact Sheet for Healthcare Providers: https://www.woods-mathews.com/ This test is not yet approved or cleared by the [place] FDA and  has been authorized for detection and/or diagnosis of SARS-CoV-2 by FDA under an Emergency Use Authorization (EUA). This EUA will remain  in effect (meaning this test can be used) for the duration of the COVID-19 declaration un der Section 564(b)(1) of the Act, 21 U.S.C. section 360bbb-3(b)(1), unless the authorization is terminated or revoked sooner. Performed at [facility], [place] [address] [place], [place] [address]     Radiology Reports CT Angio Chest PE W and/or Wo Contrast  Result Date: 05/13/2019 CLINICAL DATA:  Show less of breath and positive D-dimer EXAM: CT ANGIOGRAPHY CHEST WITH CONTRAST TECHNIQUE: Multidetector CT imaging of  the chest was performed using the standard protocol during bolus administration of intravenous contrast. Multiplanar CT image reconstructions and MIPs were obtained to evaluate the vascular anatomy. CONTRAST:  128m OMNIPAQUE IOHEXOL 350 MG/ML SOLN COMPARISON:  None. FINDINGS: Cardiovascular: Overall normal heart size but asymmetric dilatation of the right heart. Pulmonary artery visualization is degraded by  motion artifact there is diagnostic opacification and visualization to the segmental level throughout the majority of the lungs. No filling defect is seen. There is streaming artifact in the left main pulmonary artery. Mild atherosclerotic calcification of the aorta. Mediastinum/Nodes: Paucity of mediastinal fat with no suspected adenopathy or mass. Lungs/Pleura: Subpleural opacity with coarse reticular appearance. Some of this may be chronic as there is suggestion of some honeycombing and traction bronchiectasis. Dependent atelectasis. No edema, effusion, or pneumothorax. Upper Abdomen: Bilateral hepatic cystic densities. Early opacification of the aorta. Musculoskeletal: No acute or aggressive finding. Review of the MIP images confirms the above findings. IMPRESSION: 1. Interstitial opacities likely related to history of COVID 19. There may be underlying interstitial lung disease. 2. Probable right heart dilatation but no evidence of pulmonary embolism. Electronically Signed   By: Monte Fantasia M.D.   On: 05/13/2019 08:26   DG Chest Port 1 View  Result Date: 05/13/2019 CLINICAL DATA:  Shortness of breath.  COVID-19 positive EXAM: PORTABLE CHEST 1 VIEW COMPARISON:  04/07/2018 FINDINGS: Diffuse interstitial coarsening with patchy airspace type opacities at the left more than right base. No effusion or pneumothorax. Normal heart size and stable aortic contours. Artifact from EKG leads IMPRESSION: Patchy airspace and interstitial opacity correlating with history of COVID-19. Electronically Signed   By:  Monte Fantasia M.D.   On: 05/13/2019 04:34   Leg [place]  Result Date: 05/16/2019  Lower Venous Study Indications: Elevated Ddimer.  Risk Factors: COVID 19 positive. Limitations: Poor ultrasound/tissue interface and patient immobility, patient positioning, poor patient cooperation. Comparison Study: No prior studies. Performing Technologist: Oliver Hum RVT  Examination Guidelines: A complete evaluation includes B-mode imaging, spectral Doppler, color Doppler, and power Doppler as needed of all accessible portions of each vessel. Bilateral testing is considered an integral part of a complete examination. Limited examinations for reoccurring indications may be performed as noted.  +---------+---------------+---------+-----------+----------+--------------+ RIGHT    CompressibilityPhasicitySpontaneityPropertiesThrombus Aging +---------+---------------+---------+-----------+----------+--------------+ CFV      Full           Yes      Yes                                 +---------+---------------+---------+-----------+----------+--------------+ SFJ      Full                                                        +---------+---------------+---------+-----------+----------+--------------+ FV Prox  Full                                                        +---------+---------------+---------+-----------+----------+--------------+ FV Mid   Full                                                        +---------+---------------+---------+-----------+----------+--------------+ FV DistalFull                                                        +---------+---------------+---------+-----------+----------+--------------+  PFV      Full                                                        +---------+---------------+---------+-----------+----------+--------------+ POP      Full           Yes      Yes                                  +---------+---------------+---------+-----------+----------+--------------+ PTV      Full                                                        +---------+---------------+---------+-----------+----------+--------------+ PERO     Full                                                        +---------+---------------+---------+-----------+----------+--------------+   +---------+---------------+---------+-----------+----------+--------------+ LEFT     CompressibilityPhasicitySpontaneityPropertiesThrombus Aging +---------+---------------+---------+-----------+----------+--------------+ CFV      Full           Yes      Yes                                 +---------+---------------+---------+-----------+----------+--------------+ SFJ      Full                                                        +---------+---------------+---------+-----------+----------+--------------+ FV Prox  Full                                                        +---------+---------------+---------+-----------+----------+--------------+ FV Mid   Full                                                        +---------+---------------+---------+-----------+----------+--------------+ FV DistalFull                                                        +---------+---------------+---------+-----------+----------+--------------+ PFV      Full                                                        +---------+---------------+---------+-----------+----------+--------------+  POP      Full           Yes      Yes                                 +---------+---------------+---------+-----------+----------+--------------+ PTV      Full                                                        +---------+---------------+---------+-----------+----------+--------------+ PERO     Full                                                         +---------+---------------+---------+-----------+----------+--------------+     Summary: Right: There is no evidence of deep vein thrombosis in the lower extremity. However, portions of this examination were limited- see technologist comments above. No cystic structure found in the popliteal fossa. Left: There is no evidence of deep vein thrombosis in the lower extremity. However, portions of this examination were limited- see technologist comments above. No cystic structure found in the popliteal fossa.  *See table(s) above for measurements and observations. Electronically signed by Deitra Mayo MD on 05/16/2019 at 3:45:52 PM.    Final

## 2019-05-19 LAB — COMPREHENSIVE METABOLIC PANEL
ALT: 48 U/L — ABNORMAL HIGH (ref 0–44)
AST: 34 U/L (ref 15–41)
Albumin: 3.2 g/dL — ABNORMAL LOW (ref 3.5–5.0)
Alkaline Phosphatase: 132 U/L — ABNORMAL HIGH (ref 38–126)
Anion gap: 10 (ref 5–15)
BUN: 20 mg/dL (ref 8–23)
CO2: 27 mmol/L (ref 22–32)
Calcium: 9.1 mg/dL (ref 8.9–10.3)
Chloride: 109 mmol/L (ref 98–111)
Creatinine, Ser: 0.71 mg/dL (ref 0.44–1.00)
GFR calc Af Amer: >60 mL/min (ref >60)
GFR calc non Af Amer: >60 mL/min (ref >60)
Glucose, Bld: 75 mg/dL (ref 70–99)
Potassium: 3.3 mmol/L — ABNORMAL LOW (ref 3.5–5.1)
Sodium: 146 mmol/L — ABNORMAL HIGH (ref 135–145)
Total Bilirubin: 1.1 mg/dL (ref 0.3–1.2)
Total Protein: 6.4 g/dL — ABNORMAL LOW (ref 6.5–8.1)

## 2019-05-19 LAB — C-REACTIVE PROTEIN: CRP: 2 mg/dL — ABNORMAL HIGH (ref <1.0)

## 2019-05-19 LAB — CBC WITH DIFFERENTIAL/PLATELET
Abs Immature Granulocytes: 0.05 10*3/uL (ref 0.00–0.07)
Basophils Absolute: 0 10*3/uL (ref 0.0–0.1)
Basophils Relative: 0 %
Eosinophils Absolute: 0.3 10*3/uL (ref 0.0–0.5)
Eosinophils Relative: 3 %
HCT: 42.2 % (ref 36.0–46.0)
Hemoglobin: 13.4 g/dL (ref 12.0–15.0)
Immature Granulocytes: 1 %
Lymphocytes Relative: 25 %
Lymphs Abs: 2 10*3/uL (ref 0.7–4.0)
MCH: 31.3 pg (ref 26.0–34.0)
MCHC: 31.8 g/dL (ref 30.0–36.0)
MCV: 98.6 fL (ref 80.0–100.0)
Monocytes Absolute: 0.9 10*3/uL (ref 0.1–1.0)
Monocytes Relative: 12 %
Neutro Abs: 4.7 10*3/uL (ref 1.7–7.7)
Neutrophils Relative %: 59 %
Platelets: 215 10*3/uL (ref 150–400)
RBC: 4.28 MIL/uL (ref 3.87–5.11)
RDW: 19.7 % — ABNORMAL HIGH (ref 11.5–15.5)
WBC: 7.9 10*3/uL (ref 4.0–10.5)
nRBC: 0.4 % — ABNORMAL HIGH (ref 0.0–0.2)

## 2019-05-19 LAB — BRAIN NATRIURETIC PEPTIDE: B Natriuretic Peptide: 174.4 pg/mL — ABNORMAL HIGH (ref 0.0–100.0)

## 2019-05-19 LAB — GLUCOSE, CAPILLARY
Glucose-Capillary: 113 mg/dL — ABNORMAL HIGH (ref 70–99)
Glucose-Capillary: 77 mg/dL (ref 70–99)
Glucose-Capillary: 89 mg/dL (ref 70–99)
Glucose-Capillary: 90 mg/dL (ref 70–99)

## 2019-05-19 LAB — PROCALCITONIN: Procalcitonin: 0.19 ng/mL

## 2019-05-19 LAB — MAGNESIUM: Magnesium: 2.1 mg/dL (ref 1.7–2.4)

## 2019-05-19 LAB — D-DIMER, QUANTITATIVE: D-Dimer, Quant: 6.9 ug/mL-FEU — ABNORMAL HIGH (ref 0.00–0.50)

## 2019-05-19 MED ORDER — POTASSIUM CHLORIDE 2 MEQ/ML IV SOLN
INTRAVENOUS | Status: AC
  Filled 2019-05-19 (×3): qty 1000

## 2019-05-19 MED ORDER — DEXAMETHASONE 0.5 MG PO TABS
1.0000 mg | ORAL_TABLET | Freq: Every day | ORAL | Status: DC
  Administered 2019-05-20 – 2019-05-21 (×2): 1 mg via ORAL
  Filled 2019-05-19 (×4): qty 2

## 2019-05-19 MED ORDER — DEXAMETHASONE 0.5 MG PO TABS: 1.0000 mg | ORAL_TABLET | Freq: Every day | ORAL | Status: DC

## 2019-05-19 NOTE — Progress Notes (Signed)
Attempted to call to update daughter. No answer.

## 2019-05-19 NOTE — Progress Notes (Signed)
PROGRESS NOTE                                                                                                                                                                                                             Adriana Fox Demographics:    Adriana Fox, is a 74 y.o. female, DOB - 06-20-1945, RWC:[id number]  Outpatient Primary MD for the Adriana Fox is Adriana Fox, No Pcp Per    LOS - 6  Admit date - 05/13/2019    Chief Complaint  Adriana Fox presents with   Hypotension   Weakness   Shortness of Breath       Brief Narrative 73 year old with past medical history significant for dementia, hyperlipidemia, CVA with residual left-sided weakness, diagnosed with COVID-19 on 12/17.  Present with worsening shortness of breath, was diagnosed with acute hypoxic respiratory failure due to COVID-19 pneumonia and admitted to the hospital.   Subjective:   Adriana Fox in bed, appears comfortable, is somewhat sleepy but denies any headache or chest pain.  No shortness of breath.   Assessment  & Plan :    1. Acute Hypoxic Resp. Failure, Sepsis due to Acute Covid 19 Viral Pneumonitis during the ongoing 2020 Covid 19 Pandemic - she had moderate to severe disease along with sepsis and severe dehydration.  Sepsis pathophysiology has resolved, she has been treated with IV steroids and remdesivir with good effect.  Continue IV fluids for hydration.  Blood cultures 1 out of 2 showing contaminant coag negative staph.  Stopped all antibiotics.  Advance activity, titrate down steroids and oxygen as tolerated.  Oxygen is being titrated down currently on 3 to 4 L, was on 15 L initially.  Encouraged the Adriana Fox to sit up in chair in the daytime use I-S and flutter valve for pulmonary toiletry and then prone in bed when at night.  SpO2: 95 % O2 Flow Rate (L/min): 3 L/min   Recent Labs  Lab 05/13/19 0420 05/13/19 0526 05/14/19 0418 05/15/19 0258  05/16/19 0654 05/17/19 0057 05/18/19 0530 05/19/19 0600  CRP 4.3*  --  4.7* 3.5* 3.0* 2.0* 3.2* 2.0*  DDIMER >20.00*  --  >20.00* >20.00* 16.24* 13.34* 8.51* 6.90*  FERRITIN 336*  --  379* 303  --   --   --   --   BNP  --   --   --   --  482.3* 519.5* 413.5* 174.4*  PROCALCITON 0.19  --  0.19 0.28 0.39  --   --  0.19  SARSCOV2NAA  --  POSITIVE*  --   --   --   --   --   --     Hepatic Function Latest Ref Rng & Units 05/19/2019 05/18/2019 05/17/2019  Total Protein 6.5 - 8.1 g/dL 6.4(L) 5.9(L) 5.9(L)  Albumin 3.5 - 5.0 g/dL 3.2(L) 2.9(L) 2.7(L)  AST 15 - 41 U/L 34 29 42(H)  ALT 0 - 44 U/L 48(H) 43 52(H)  Alk Phosphatase 38 - 126 U/L 132(H) 130(H) 142(H)  Total Bilirubin 0.3 - 1.2 mg/dL 1.1 1.1 0.5      2.  COVID-19 viral illness induced transaminitis.  Symptom-free, trend stable will monitor.  Is on statin monitor closely.  3.  Extremely elevated D-dimer.  CTA lungs and lower extremity and leg ultrasound unremarkable, D-dimer is now trending down on full dose Lovenox and will switch to high dose prophylactic Lovenox on 05/17/2019.  Continue to monitor D-dimer closely.  4.  Hypotension and poor oral intake.  Gentle IV fluid hydration on 05/17/2019 and monitor closely.  5.  Dementia, CVA, left-sided hemiparesis.  Supportive care.  At risk for delirium, avoid benzodiazepines and narcotics.  Continue Plavix for secondary prevention along with statin.  6.  Toxic and metabolic encephalopathy due to COVID-19 infection in the setting of dementia and CVA.  Per daughter she uses trazodone twice daily to reduce her confusion and anxiety, she has been placed on home dose trazodone, overall stable.  7.  Mild hypernatremia and hypokalemia.  D5W with KCl IV.    Condition -   Fair  Family Communication  : Daughter over the phone on 05/15/2019, message left again on 05/19/2019  Code Status : Full  Diet :   Diet Order            Diet Heart Room service appropriate? Yes with Assist; Fluid consistency:  Thin  Diet effective now               Disposition Plan  : Likely home in next 1 to 2 days  Consults  : None  Procedures  :  CTA.  No PE.  Leg ultrasound.  Limited but negative study.  PUD Prophylaxis : None  DVT Prophylaxis  :  Lovenox  High dose  Lab Results  Component Value Date   PLT 215 05/19/2019    Inpatient Medications  Scheduled Meds:  albuterol  2 puff Inhalation Q6H   vitamin C  500 mg Oral Daily   atorvastatin  40 mg Oral Daily   clopidogrel  75 mg Oral Daily   dexamethasone (DECADRON) injection  2 mg Intravenous Q24H   enoxaparin (LOVENOX) injection  30 mg Subcutaneous Q12H   famotidine  20 mg Oral BID   memantine  5 mg Oral Daily   sodium chloride flush  3 mL Intravenous Q12H   traZODone  25 mg Oral BID   zinc sulfate  220 mg Oral Daily   Continuous Infusions:  PRN Meds:.[email or web address]-dextromethorphan  Antibiotics  :    Anti-infectives (From admission, onward)   Start     Dose/Rate Route Frequency Ordered Stop   05/17/19 1600  remdesivir 100 mg in sodium chloride 0.9 % 100 mL IVPB     100 mg 200 mL/hr over 30 Minutes Intravenous Daily 05/17/19 1558 05/17/19 1716   05/14/19 1500  vancomycin (VANCOCIN) IVPB 1000 mg/200 mL premix  Status:  Discontinued  1,000 mg 200 mL/hr over 60 Minutes Intravenous Every 24 hours 05/14/19 1354 05/15/19 1253   05/14/19 1000  remdesivir 100 mg in sodium chloride 0.9 % 100 mL IVPB  Status:  Discontinued     100 mg 200 mL/hr over 30 Minutes Intravenous Daily 05/13/19 1730 05/17/19 1102   05/13/19 1745  remdesivir 200 mg in sodium chloride 0.9% 250 mL IVPB     200 mg 580 mL/hr over 30 Minutes Intravenous Once 05/13/19 1730 05/13/19 2035       Time Spent in minutes  30   Lala Lund M.D on 05/19/2019 at 9:58 AM  To page go to [email or web address] - password [facility]  [facility] -  Office  [phone]     See all Orders from today for further details    Objective:   Vitals:   05/18/19  1640 05/18/19 2000 05/19/19 0455 05/19/19 0755  BP: 104/70 121/72 102/74 108/75  Pulse: 99 99 87 88  Resp: 20 (!) '21 19 18  ' Temp: 97.8 F (36.6 C) 98.4 F (36.9 C) 98.1 F (36.7 C) (!) 97.3 F (36.3 C)  TempSrc: Axillary Axillary Axillary Axillary  SpO2: 96% 95%  95%  Weight:      Height:        Wt Readings from Last 3 Encounters:  05/13/19 50.1 kg     Intake/Output Summary (Last 24 hours) at 05/19/2019 0958 Last data filed at 05/19/2019 1021 Gross per 24 hour  Intake 1080 ml  Output 2700 ml  Net -1620 ml     Physical Exam  Awake but pleasantly confused, appears to be in no distress and mild left-sided weakness which is chronic [place].AT,PERRAL Supple Neck,No JVD, No cervical lymphadenopathy appriciated.  Symmetrical Chest wall movement, Good air movement bilaterally, CTAB RRR,No Gallops, Rubs or new Murmurs, No Parasternal Heave +ve B.Sounds, Abd Soft, No tenderness, No organomegaly appriciated, No rebound - guarding or rigidity. No Cyanosis, Clubbing or edema, No new Rash or bruise     Data Review:    CBC Recent Labs  Lab 05/15/19 0258 05/16/19 0654 05/17/19 0057 05/18/19 0530 05/19/19 0600  WBC 8.6 8.3 9.1 7.3 7.9  HGB 11.7* 11.9* 11.4* 12.0 13.4  HCT 35.5* 37.4 35.3* 37.6 42.2  PLT 184 174 164 197 215  MCV 96.2 98.9 97.8 97.2 98.6  MCH 31.7 31.5 31.6 31.0 31.3  MCHC 33.0 31.8 32.3 31.9 31.8  RDW 17.3* 18.4* 18.6* 19.3* 19.7*  LYMPHSABS 1.3 1.0 1.0 1.5 2.0  MONOABS 0.6 0.5 0.7 0.7 0.9  EOSABS 0.0 0.0 0.0 0.0 0.3  BASOSABS 0.0 0.0 0.0 0.0 0.0    Chemistries  Recent Labs  Lab 05/15/19 0258 05/16/19 0654 05/17/19 0057 05/18/19 0530 05/19/19 0600  NA 144 146* 142 145 146*  K 3.5 4.1 3.8 3.8 3.3*  CL 114* 115* 114* 112* 109  CO2 21* 22 19* 22 27  GLUCOSE 114* 86 109* 70 75  BUN 28* 26* '23 17 20  ' CREATININE 0.83 0.76 0.63 0.56 0.71  CALCIUM 8.3* 8.6* 8.4* 8.6* 9.1  MG 2.4 2.4 2.1 2.1 2.1  AST 45* 51* 42* 29 34  ALT 49* 55* 52* 43 48*  ALKPHOS  164* 159* 142* 130* 132*  BILITOT 0.6 0.9 0.5 1.1 1.1   ------------------------------------------------------------------------------------------------------------------ No results for input(s): CHOL, HDL, LDLCALC, TRIG, CHOLHDL, LDLDIRECT in the last 72 hours.  No results found for: HGBA1C ------------------------------------------------------------------------------------------------------------------ No results for input(s): TSH, T4TOTAL, T3FREE, THYROIDAB in the last 72 hours.  Invalid input(s): FREET3  Cardiac Enzymes No results for input(s): CKMB, TROPONINI, MYOGLOBIN in the last 168 hours.  Invalid input(s): CK ------------------------------------------------------------------------------------------------------------------    Component Value Date/Time   BNP 174.4 (H) 05/19/2019 0600    Micro Results Recent Results (from the past 240 hour(s))  Blood Culture (routine x 2)     Status: Abnormal   Collection Time: 05/13/19  4:22 AM   Specimen: BLOOD  Result Value Ref Range Status   Specimen Description BLOOD LEFT ANTECUBITAL  Final   Special Requests   Final    BOTTLES DRAWN AEROBIC AND ANAEROBIC Blood Culture adequate volume   Culture  Setup Time   Final    IN BOTH AEROBIC AND ANAEROBIC BOTTLES GRAM POSITIVE COCCI CRITICAL RESULT CALLED TO, READ BACK BY AND VERIFIED WITH: J LEDFORD PHARMD 05/13/19 2240 JDW    Culture (A)  Final    STAPHYLOCOCCUS SPECIES (COAGULASE NEGATIVE) THE SIGNIFICANCE OF ISOLATING THIS ORGANISM FROM A SINGLE SET OF BLOOD CULTURES WHEN MULTIPLE SETS ARE DRAWN IS UNCERTAIN. PLEASE NOTIFY THE MICROBIOLOGY DEPARTMENT WITHIN ONE WEEK IF SPECIATION AND SENSITIVITIES ARE REQUIRED. Performed at [facility], [place] [address] [place], [place] [address]    Report Status 05/15/2019 FINAL  Final  Blood Culture (routine x 2)     Status: None   Collection Time: 05/13/19  4:22 AM   Specimen: BLOOD  Result Value Ref Range Status   Specimen Description  BLOOD RIGHT ANTECUBITAL  Final   Special Requests   Final    BOTTLES DRAWN AEROBIC AND ANAEROBIC Blood Culture adequate volume   Culture   Final    NO GROWTH 5 DAYS Performed at [facility], [place] [address] [place], [place] [address]    Report Status 05/18/2019 FINAL  Final  SARS CORONAVIRUS 2 (TAT 6-24 HRS) Nasopharyngeal Nasopharyngeal Swab     Status: Abnormal   Collection Time: 05/13/19  5:26 AM   Specimen: Nasopharyngeal Swab  Result Value Ref Range Status   SARS Coronavirus 2 POSITIVE (A) NEGATIVE Final    Comment: emailed L. Berdik RN 14:15 05/13/19 ([email or web address]) (NOTE) SARS-CoV-2 target nucleic acids are DETECTED. The SARS-CoV-2 RNA is generally detectable in upper and lower respiratory specimens during the acute phase of infection. Positive results are indicative of the presence of SARS-CoV-2 RNA. Clinical correlation with Adriana Fox history and other diagnostic information is  necessary to determine Adriana Fox infection status. Positive results do not rule out bacterial infection or co-infection with other viruses.  The expected result is Negative. Fact Sheet for Patients: SugarRoll.be Fact Sheet for Healthcare Providers: https://www.woods-mathews.com/ This test is not yet approved or cleared by the [place] FDA and  has been authorized for detection and/or diagnosis of SARS-CoV-2 by FDA under an Emergency Use Authorization (EUA). This EUA will remain  in effect (meaning this test can be used) for the duration of the COVID-19 declaration un der Section 564(b)(1) of the Act, 21 U.S.C. section 360bbb-3(b)(1), unless the authorization is terminated or revoked sooner. Performed at [facility], [place] [address], [place], [place] [address]     Radiology Reports CT Angio Chest PE W and/or Wo Contrast  Result Date: 05/13/2019 CLINICAL DATA:  Show less of breath and positive D-dimer EXAM: CT ANGIOGRAPHY CHEST WITH CONTRAST  TECHNIQUE: Multidetector CT imaging of the chest was performed using the standard protocol during bolus administration of intravenous contrast. Multiplanar CT image reconstructions and MIPs were obtained to evaluate the vascular anatomy. CONTRAST:  137m OMNIPAQUE IOHEXOL 350 MG/ML SOLN COMPARISON:  None. FINDINGS: Cardiovascular: Overall normal heart size but asymmetric dilatation of the right heart. Pulmonary artery visualization is degraded by motion artifact there is diagnostic opacification and visualization to the segmental level throughout the majority of the lungs. No filling defect is seen. There is streaming artifact in the left main pulmonary artery. Mild atherosclerotic calcification of the aorta. Mediastinum/Nodes: Paucity of mediastinal fat with no suspected adenopathy or mass. Lungs/Pleura: Subpleural opacity with coarse reticular appearance. Some of this may be chronic as there is suggestion of some honeycombing and traction bronchiectasis. Dependent atelectasis. No edema, effusion, or pneumothorax. Upper Abdomen: Bilateral hepatic cystic densities. Early opacification of the aorta. Musculoskeletal: No acute or aggressive finding. Review of the MIP images confirms the above findings. IMPRESSION: 1. Interstitial opacities likely related to history of COVID 19. There may be underlying interstitial lung disease. 2. Probable right heart dilatation but no evidence of pulmonary embolism. Electronically Signed   By: Monte Fantasia M.D.   On: 05/13/2019 08:26   DG Chest Port 1 View  Result Date: 05/18/2019 CLINICAL DATA:  Shortness of breath. Additional history provided: Diagnosed with COVID-19 on 12/17. Worsening shortness of breath, hypoxic respiratory failure due to COVID pneumonia. EXAM: PORTABLE CHEST 1 VIEW COMPARISON:  CT angiogram chest 05/13/2019, chest radiograph 05/13/2019 FINDINGS: Heart size at the upper limits of normal. Redemonstrated bilateral interstitial opacities. Superimposed patchy  airspace opacities most notable in the left lung base. Underlying interstitial lung disease is again questioned. Overall, findings are similar to prior chest radiograph 05/13/2019. No sizable pleural effusion or evidence of pneumothorax. No acute bony abnormality. IMPRESSION: Bilateral interstitial prominence. Superimposed patchy airspace opacities most notable in the left lower lobe. Overall, findings are similar to prior chest radiograph 05/13/2019 and likely reflect multifocal pneumonia. A background of chronic interstitial lung disease is again questioned. Electronically Signed   By: Kellie Simmering DO   On: 05/18/2019 15:10   DG Chest Port 1 View  Result Date: 05/13/2019 CLINICAL DATA:  Shortness of breath.  COVID-19 positive EXAM: PORTABLE CHEST 1 VIEW COMPARISON:  04/07/2018 FINDINGS: Diffuse interstitial coarsening with patchy airspace type opacities at the left more than right base. No effusion or pneumothorax. Normal heart size and stable aortic contours. Artifact from EKG leads IMPRESSION: Patchy airspace and interstitial opacity correlating with history of COVID-19. Electronically Signed   By: Monte Fantasia M.D.   On: 05/13/2019 04:34   Leg [place] Cone  Result Date: 05/16/2019  Lower Venous Study Indications: Elevated Ddimer.  Risk Factors: COVID 19 positive. Limitations: Poor ultrasound/tissue interface and Adriana Fox immobility, Adriana Fox positioning, poor Adriana Fox cooperation. Comparison Study: No prior studies. Performing Technologist: Oliver Hum RVT  Examination Guidelines: A complete evaluation includes B-mode imaging, spectral Doppler, color Doppler, and power Doppler as needed of all accessible portions of each vessel. Bilateral testing is considered an integral part of a complete examination. Limited examinations for reoccurring indications may be performed as noted.  +---------+---------------+---------+-----------+----------+--------------+  RIGHT      Compressibility Phasicity Spontaneity Properties Thrombus Aging  +---------+---------------+---------+-----------+----------+--------------+  CFV       Full            Yes       Yes                                    +---------+---------------+---------+-----------+----------+--------------+  SFJ       Full                                                             +---------+---------------+---------+-----------+----------+--------------+  FV Prox   Full                                                             +---------+---------------+---------+-----------+----------+--------------+  FV Mid    Full                                                             +---------+---------------+---------+-----------+----------+--------------+  FV Distal Full                                                             +---------+---------------+---------+-----------+----------+--------------+  PFV       Full                                                             +---------+---------------+---------+-----------+----------+--------------+  POP       Full            Yes       Yes                                    +---------+---------------+---------+-----------+----------+--------------+  PTV       Full                                                             +---------+---------------+---------+-----------+----------+--------------+  PERO      Full                                                             +---------+---------------+---------+-----------+----------+--------------+   +---------+---------------+---------+-----------+----------+--------------+  LEFT      Compressibility Phasicity Spontaneity Properties Thrombus Aging  +---------+---------------+---------+-----------+----------+--------------+  CFV       Full            Yes       Yes                                    +---------+---------------+---------+-----------+----------+--------------+  SFJ       Full                                                              +---------+---------------+---------+-----------+----------+--------------+  FV Prox   Full                                                             +---------+---------------+---------+-----------+----------+--------------+  FV Mid    Full                                                             +---------+---------------+---------+-----------+----------+--------------+  FV Distal Full                                                             +---------+---------------+---------+-----------+----------+--------------+  PFV       Full                                                             +---------+---------------+---------+-----------+----------+--------------+  POP       Full            Yes       Yes                                    +---------+---------------+---------+-----------+----------+--------------+  PTV       Full                                                             +---------+---------------+---------+-----------+----------+--------------+  PERO      Full                                                             +---------+---------------+---------+-----------+----------+--------------+     Summary: Right: There is no evidence of deep vein thrombosis in the lower extremity. However, portions of this examination were limited- see technologist comments above. No cystic structure found in the popliteal fossa. Left: There is no evidence of deep vein thrombosis in the lower extremity. However, portions of this examination were limited- see technologist comments above. No cystic structure found in the popliteal fossa.  *See table(s) above for measurements and observations. Electronically signed by Deitra Mayo MD on 05/16/2019 at 3:45:52 PM.    Final

## 2019-05-20 LAB — CBC WITH DIFFERENTIAL/PLATELET
Abs Immature Granulocytes: 0.05 10*3/uL (ref 0.00–0.07)
Basophils Absolute: 0 10*3/uL (ref 0.0–0.1)
Basophils Relative: 0 %
Eosinophils Absolute: 0.3 10*3/uL (ref 0.0–0.5)
Eosinophils Relative: 5 %
HCT: 39.2 % (ref 36.0–46.0)
Hemoglobin: 12.5 g/dL (ref 12.0–15.0)
Immature Granulocytes: 1 %
Lymphocytes Relative: 32 %
Lymphs Abs: 2.2 10*3/uL (ref 0.7–4.0)
MCH: 31.3 pg (ref 26.0–34.0)
MCHC: 31.9 g/dL (ref 30.0–36.0)
MCV: 98 fL (ref 80.0–100.0)
Monocytes Absolute: 0.7 10*3/uL (ref 0.1–1.0)
Monocytes Relative: 11 %
Neutro Abs: 3.6 10*3/uL (ref 1.7–7.7)
Neutrophils Relative %: 51 %
Platelets: UNDETERMINED 10*3/uL (ref 150–400)
RBC: 4 MIL/uL (ref 3.87–5.11)
RDW: 18.9 % — ABNORMAL HIGH (ref 11.5–15.5)
WBC: 6.9 10*3/uL (ref 4.0–10.5)
nRBC: 0 % (ref 0.0–0.2)

## 2019-05-20 LAB — COMPREHENSIVE METABOLIC PANEL
ALT: 41 U/L (ref 0–44)
AST: 39 U/L (ref 15–41)
Albumin: 2.7 g/dL — ABNORMAL LOW (ref 3.5–5.0)
Alkaline Phosphatase: 119 U/L (ref 38–126)
Anion gap: 10 (ref 5–15)
BUN: 21 mg/dL (ref 8–23)
CO2: 23 mmol/L (ref 22–32)
Calcium: 8.4 mg/dL — ABNORMAL LOW (ref 8.9–10.3)
Chloride: 106 mmol/L (ref 98–111)
Creatinine, Ser: 0.6 mg/dL (ref 0.44–1.00)
GFR calc Af Amer: >60 mL/min (ref >60)
GFR calc non Af Amer: >60 mL/min (ref >60)
Glucose, Bld: 59 mg/dL — ABNORMAL LOW (ref 70–99)
Potassium: 4.6 mmol/L (ref 3.5–5.1)
Sodium: 139 mmol/L (ref 135–145)
Total Bilirubin: 0.9 mg/dL (ref 0.3–1.2)
Total Protein: 5.7 g/dL — ABNORMAL LOW (ref 6.5–8.1)

## 2019-05-20 LAB — MAGNESIUM: Magnesium: 2 mg/dL (ref 1.7–2.4)

## 2019-05-20 LAB — BRAIN NATRIURETIC PEPTIDE: B Natriuretic Peptide: 189.6 pg/mL — ABNORMAL HIGH (ref 0.0–100.0)

## 2019-05-20 LAB — GLUCOSE, CAPILLARY
Glucose-Capillary: 102 mg/dL — ABNORMAL HIGH (ref 70–99)
Glucose-Capillary: 108 mg/dL — ABNORMAL HIGH (ref 70–99)
Glucose-Capillary: 112 mg/dL — ABNORMAL HIGH (ref 70–99)
Glucose-Capillary: 117 mg/dL — ABNORMAL HIGH (ref 70–99)
Glucose-Capillary: 72 mg/dL (ref 70–99)
Glucose-Capillary: 81 mg/dL (ref 70–99)
Glucose-Capillary: 88 mg/dL (ref 70–99)

## 2019-05-20 LAB — C-REACTIVE PROTEIN: CRP: 1 mg/dL — ABNORMAL HIGH (ref <1.0)

## 2019-05-20 LAB — PROCALCITONIN: Procalcitonin: <0.1 ng/mL

## 2019-05-20 LAB — D-DIMER, QUANTITATIVE: D-Dimer, Quant: 5.34 ug/mL-FEU — ABNORMAL HIGH (ref 0.00–0.50)

## 2019-05-20 MED ORDER — FUROSEMIDE 10 MG/ML IJ SOLN
40.0000 mg | Freq: Once | INTRAMUSCULAR | Status: AC
  Administered 2019-05-20: 13:00:00 40 mg via INTRAVENOUS
  Filled 2019-05-20: qty 4

## 2019-05-20 NOTE — Progress Notes (Signed)
PROGRESS NOTE                                                                                                                                                                                                             Patient Demographics:    Adriana Fox, is a 74 y.o. female, DOB - 06/27/1945, YTK:[id number]  Outpatient Primary MD for the patient is Patient, No Pcp Per    LOS - 7  Admit date - 05/13/2019    Chief Complaint  Patient presents with  . Hypotension  . Weakness  . Shortness of Breath       Brief Narrative 74 year old with past medical history significant for dementia, hyperlipidemia, CVA with residual left-sided weakness, diagnosed with COVID-19 on 12/17.  Present with worsening shortness of breath, was diagnosed with acute hypoxic respiratory failure due to COVID-19 pneumonia and admitted to the hospital.   Subjective:   Patient in bed, appears comfortable, remains confused but in no distress, denies any headache chest or abdominal pain, denies any shortness of breath.   Assessment  & Plan :    1. Acute Hypoxic Resp. Failure, Sepsis due to Acute Covid 19 Viral Pneumonitis during the ongoing 2020 Covid 19 Pandemic - she had moderate to severe disease along with sepsis and severe dehydration.  Sepsis pathophysiology has resolved, she has been treated with IV steroids and remdesivir with good effect.  Process pathophysiology has resolved, note her oxygen readings are extremely dependent on her body position and oxygen probe, multiple times her probe has been adjusted with oxygen requirement less than 2 L however if she changes posture or the probe is pulled her pulse ox drops prompting the staff to increase her oxygen flow rate, staff has been informed multiple times over several days.  Encouraged the patient to sit up in chair in the daytime use I-S and flutter valve for pulmonary toiletry and then prone in bed  when at night.  SpO2: (!) 88 % O2 Flow Rate (L/min): 5 L/min   Recent Labs  Lab 05/14/19 0418 05/15/19 0258 05/16/19 0654 05/17/19 0057 05/18/19 0530 05/19/19 0600 05/20/19 0443  CRP 4.7* 3.5* 3.0* 2.0* 3.2* 2.0* 1.0*  DDIMER >20.00* >20.00* 16.24* 13.34* 8.51* 6.90* 5.34*  FERRITIN 379* 303  --   --   --   --   --  BNP  --   --  482.3* 519.5* 413.5* 174.4* 189.6*  PROCALCITON 0.19 0.28 0.39  --   --  0.19 <0.10    Hepatic Function Latest Ref Rng & Units 05/20/2019 05/19/2019 05/18/2019  Total Protein 6.5 - 8.1 g/dL 5.7(L) 6.4(L) 5.9(L)  Albumin 3.5 - 5.0 g/dL 2.7(L) 3.2(L) 2.9(L)  AST 15 - 41 U/L 39 34 29  ALT 0 - 44 U/L 41 48(H) 43  Alk Phosphatase 38 - 126 U/L 119 132(H) 130(H)  Total Bilirubin 0.3 - 1.2 mg/dL 0.9 1.1 1.1      2.  COVID-19 viral illness induced transaminitis.  Symptom-free, trend stable and almost completely resolved.  Is on statin monitor closely.  3.  Extremely elevated D-dimer.  CTA lungs and lower extremity and leg ultrasound unremarkable, D-dimer is now trending down on full dose Lovenox and will switch to high dose prophylactic Lovenox on 05/17/2019.  Continue to monitor D-dimer closely.  4.  Hypotension and poor oral intake.  Gentle IV fluid hydration on 05/17/2019 and monitor closely.  5.  Dementia, CVA, left-sided hemiparesis.  Supportive care.  At risk for delirium, avoid benzodiazepines and narcotics.  Continue Plavix for secondary prevention along with statin.  6.  Toxic and metabolic encephalopathy due to COVID-19 infection in the setting of dementia and CVA.  Per daughter she uses trazodone twice daily to reduce her confusion and anxiety, she has been placed on home dose trazodone, overall stable.  7.  Mild hypernatremia and hypokalemia.  D5W with KCl IV.    Condition -   Fair  Family Communication  : Daughter over the phone on 05/15/2019, message left again on 05/19/2019  Code Status : Full  Diet :   Diet Order            Diet Heart Room  service appropriate? Yes with Assist; Fluid consistency: Thin  Diet effective now               Disposition Plan  : Caryl Pina daughter wanted her to come home but now wants SNF  Consults  : None  Procedures  :  CTA.  No PE.  Leg ultrasound.  Limited but negative study.  PUD Prophylaxis : None  DVT Prophylaxis  :  Lovenox  High dose  Lab Results  Component Value Date   PLT PLATELET CLUMPS NOTED ON SMEAR, UNABLE TO ESTIMATE 05/20/2019    Inpatient Medications  Scheduled Meds: . albuterol  2 puff Inhalation Q6H  . vitamin C  500 mg Oral Daily  . atorvastatin  40 mg Oral Daily  . clopidogrel  75 mg Oral Daily  . dexamethasone  1 mg Oral Daily  . enoxaparin (LOVENOX) injection  30 mg Subcutaneous Q12H  . famotidine  20 mg Oral BID  . memantine  5 mg Oral Daily  . sodium chloride flush  3 mL Intravenous Q12H  . traZODone  25 mg Oral BID  . zinc sulfate  220 mg Oral Daily   Continuous Infusions:  PRN Meds:.guaiFENesin-dextromethorphan  Antibiotics  :    Anti-infectives (From admission, onward)   Start     Dose/Rate Route Frequency Ordered Stop   05/17/19 1600  remdesivir 100 mg in sodium chloride 0.9 % 100 mL IVPB     100 mg 200 mL/hr over 30 Minutes Intravenous Daily 05/17/19 1558 05/17/19 1716   05/14/19 1500  vancomycin (VANCOCIN) IVPB 1000 mg/200 mL premix  Status:  Discontinued     1,000 mg 200 mL/hr over 60  Minutes Intravenous Every 24 hours 05/14/19 1354 05/15/19 1253   05/14/19 1000  remdesivir 100 mg in sodium chloride 0.9 % 100 mL IVPB  Status:  Discontinued     100 mg 200 mL/hr over 30 Minutes Intravenous Daily 05/13/19 1730 05/17/19 1102   05/13/19 1745  remdesivir 200 mg in sodium chloride 0.9% 250 mL IVPB     200 mg 580 mL/hr over 30 Minutes Intravenous Once 05/13/19 1730 05/13/19 2035       Time Spent in minutes  30   Lala Lund M.D on 05/20/2019 at 11:52 AM  To page go to [email or web address] - password [facility]  [facility] -  Office   [phone]     See all Orders from today for further details    Objective:   Vitals:   05/20/19 0400 05/20/19 0701 05/20/19 0812 05/20/19 1100  BP: 111/77  110/83   Pulse: 74  72   Resp: _0 Temp: 98.3 F (36.8 C)  98.2 F (36.8 C)   TempSrc: Axillary     SpO2: 100% 95% 92% (!) 88%  Weight:      Height:        Wt Readings from Last 3 Encounters:  05/13/19 50.1 kg     Intake/Output Summary (Last 24 hours) at 05/20/2019 1152 Last data filed at 05/20/2019 0923 Gross per 24 hour  Intake 1434.28 ml  Output 300 ml  Net 1134.28 ml     Physical Exam  Awake but pleasantly confused, appears to be in no distress and mild left-sided weakness which is chronic [place].AT,PERRAL Supple Neck,No JVD, No cervical lymphadenopathy appriciated.  Symmetrical Chest wall movement, Good air movement bilaterally, CTAB RRR,No Gallops, Rubs or new Murmurs, No Parasternal Heave +ve B.Sounds, Abd Soft, No tenderness, No organomegaly appriciated, No rebound - guarding or rigidity. No Cyanosis, Clubbing or edema, No new Rash or bruise      Data Review:    CBC Recent Labs  Lab 05/16/19 0654 05/17/19 0057 05/18/19 0530 05/19/19 0600 05/20/19 0443  WBC 8.3 9.1 7.3 7.9 6.9  HGB 11.9* 11.4* 12.0 13.4 12.5  HCT 37.4 35.3* 37.6 42.2 39.2  PLT 174 164 197 215 PLATELET CLUMPS NOTED ON SMEAR, UNABLE TO ESTIMATE  MCV 98.9 97.8 97.2 98.6 98.0  MCH 31.5 31.6 31.0 31.3 31.3  MCHC 31.8 32.3 31.9 31.8 31.9  RDW 18.4* 18.6* 19.3* 19.7* 18.9*  LYMPHSABS 1.0 1.0 1.5 2.0 2.2  MONOABS 0.5 0.7 0.7 0.9 0.7  EOSABS 0.0 0.0 0.0 0.3 0.3  BASOSABS 0.0 0.0 0.0 0.0 0.0    Chemistries  Recent Labs  Lab 05/16/19 0654 05/17/19 0057 05/18/19 0530 05/19/19 0600 05/20/19 0443  NA 146* 142 145 146* 139  K 4.1 3.8 3.8 3.3* 4.6  CL 115* 114* 112* 109 106  CO2 22 19* _1 GLUCOSE 86 109* 70 75 59*  BUN 26* _2 CREATININE 0.76 0.63 0.56 0.71 0.60  CALCIUM 8.6* 8.4* 8.6* 9.1 8.4*  MG  2.4 2.1 2.1 2.1 2.0  AST 51* 42* 29 34 39  ALT 55* 52* 43 48* 41  ALKPHOS 159* 142* 130* 132* 119  BILITOT 0.9 0.5 1.1 1.1 0.9   ------------------------------------------------------------------------------------------------------------------ No results for input(s): CHOL, HDL, LDLCALC, TRIG, CHOLHDL, LDLDIRECT in the last 72 hours.  No results found for: HGBA1C ------------------------------------------------------------------------------------------------------------------ No results for input(s): TSH, T4TOTAL, T3FREE, THYROIDAB in the last 72 hours.  Invalid input(s): FREET3  Cardiac Enzymes No results for  input(s): CKMB, TROPONINI, MYOGLOBIN in the last 168 hours.  Invalid input(s): CK ------------------------------------------------------------------------------------------------------------------    Component Value Date/Time   BNP 189.6 (H) 05/20/2019 0443    Micro Results Recent Results (from the past 240 hour(s))  Blood Culture (routine x 2)     Status: Abnormal   Collection Time: 05/13/19  4:22 AM   Specimen: BLOOD  Result Value Ref Range Status   Specimen Description BLOOD LEFT ANTECUBITAL  Final   Special Requests   Final    BOTTLES DRAWN AEROBIC AND ANAEROBIC Blood Culture adequate volume   Culture  Setup Time   Final    IN BOTH AEROBIC AND ANAEROBIC BOTTLES GRAM POSITIVE COCCI CRITICAL RESULT CALLED TO, READ BACK BY AND VERIFIED WITH: J LEDFORD PHARMD 05/13/19 2240 JDW    Culture (A)  Final    STAPHYLOCOCCUS SPECIES (COAGULASE NEGATIVE) THE SIGNIFICANCE OF ISOLATING THIS ORGANISM FROM A SINGLE SET OF BLOOD CULTURES WHEN MULTIPLE SETS ARE DRAWN IS UNCERTAIN. PLEASE NOTIFY THE MICROBIOLOGY DEPARTMENT WITHIN ONE WEEK IF SPECIATION AND SENSITIVITIES ARE REQUIRED. Performed at [facility], [place] [address], [place], [place] [address]    Report Status 05/15/2019 FINAL  Final  Blood Culture (routine x 2)     Status: None   Collection Time: 05/13/19  4:22 AM    Specimen: BLOOD  Result Value Ref Range Status   Specimen Description BLOOD RIGHT ANTECUBITAL  Final   Special Requests   Final    BOTTLES DRAWN AEROBIC AND ANAEROBIC Blood Culture adequate volume   Culture   Final    NO GROWTH 5 DAYS Performed at [facility], [place] [address], [place], [place] [address]    Report Status 05/18/2019 FINAL  Final  SARS CORONAVIRUS 2 (TAT 6-24 HRS) Nasopharyngeal Nasopharyngeal Swab     Status: Abnormal   Collection Time: 05/13/19  5:26 AM   Specimen: Nasopharyngeal Swab  Result Value Ref Range Status   SARS Coronavirus 2 POSITIVE (A) NEGATIVE Final    Comment: emailed L. Berdik RN 14:15 05/13/19 ([email or web address]) (NOTE) SARS-CoV-2 target nucleic acids are DETECTED. The SARS-CoV-2 RNA is generally detectable in upper and lower respiratory specimens during the acute phase of infection. Positive results are indicative of the presence of SARS-CoV-2 RNA. Clinical correlation with patient history and other diagnostic information is  necessary to determine patient infection status. Positive results do not rule out bacterial infection or co-infection with other viruses.  The expected result is Negative. Fact Sheet for Patients: SugarRoll.be Fact Sheet for Healthcare Providers: https://www.woods-mathews.com/ This test is not yet approved or cleared by the [place] FDA and  has been authorized for detection and/or diagnosis of SARS-CoV-2 by FDA under an Emergency Use Authorization (EUA). This EUA will remain  in effect (meaning this test can be used) for the duration of the COVID-19 declaration un der Section 564(b)(1) of the Act, 21 U.S.C. section 360bbb-3(b)(1), unless the authorization is terminated or revoked sooner. Performed at [facility], [place] [address], [place], [place] [address]     Radiology Reports CT Angio Chest PE W and/or Wo Contrast  Result Date: 05/13/2019 CLINICAL DATA:  Show less of  breath and positive D-dimer EXAM: CT ANGIOGRAPHY CHEST WITH CONTRAST TECHNIQUE: Multidetector CT imaging of the chest was performed using the standard protocol during bolus administration of intravenous contrast. Multiplanar CT image reconstructions and MIPs were obtained to evaluate the vascular anatomy. CONTRAST:  134m OMNIPAQUE IOHEXOL 350 MG/ML SOLN COMPARISON:  None. FINDINGS: Cardiovascular: Overall  normal heart size but asymmetric dilatation of the right heart. Pulmonary artery visualization is degraded by motion artifact there is diagnostic opacification and visualization to the segmental level throughout the majority of the lungs. No filling defect is seen. There is streaming artifact in the left main pulmonary artery. Mild atherosclerotic calcification of the aorta. Mediastinum/Nodes: Paucity of mediastinal fat with no suspected adenopathy or mass. Lungs/Pleura: Subpleural opacity with coarse reticular appearance. Some of this may be chronic as there is suggestion of some honeycombing and traction bronchiectasis. Dependent atelectasis. No edema, effusion, or pneumothorax. Upper Abdomen: Bilateral hepatic cystic densities. Early opacification of the aorta. Musculoskeletal: No acute or aggressive finding. Review of the MIP images confirms the above findings. IMPRESSION: 1. Interstitial opacities likely related to history of COVID 19. There may be underlying interstitial lung disease. 2. Probable right heart dilatation but no evidence of pulmonary embolism. Electronically Signed   By: Monte Fantasia M.D.   On: 05/13/2019 08:26   DG Chest Port 1 View  Result Date: 05/18/2019 CLINICAL DATA:  Shortness of breath. Additional history provided: Diagnosed with COVID-19 on 12/17. Worsening shortness of breath, hypoxic respiratory failure due to COVID pneumonia. EXAM: PORTABLE CHEST 1 VIEW COMPARISON:  CT angiogram chest 05/13/2019, chest radiograph 05/13/2019 FINDINGS: Heart size at the upper limits of normal.  Redemonstrated bilateral interstitial opacities. Superimposed patchy airspace opacities most notable in the left lung base. Underlying interstitial lung disease is again questioned. Overall, findings are similar to prior chest radiograph 05/13/2019. No sizable pleural effusion or evidence of pneumothorax. No acute bony abnormality. IMPRESSION: Bilateral interstitial prominence. Superimposed patchy airspace opacities most notable in the left lower lobe. Overall, findings are similar to prior chest radiograph 05/13/2019 and likely reflect multifocal pneumonia. A background of chronic interstitial lung disease is again questioned. Electronically Signed   By: Kellie Simmering DO   On: 05/18/2019 15:10   DG Chest Port 1 View  Result Date: 05/13/2019 CLINICAL DATA:  Shortness of breath.  COVID-19 positive EXAM: PORTABLE CHEST 1 VIEW COMPARISON:  04/07/2018 FINDINGS: Diffuse interstitial coarsening with patchy airspace type opacities at the left more than right base. No effusion or pneumothorax. Normal heart size and stable aortic contours. Artifact from EKG leads IMPRESSION: Patchy airspace and interstitial opacity correlating with history of COVID-19. Electronically Signed   By: Monte Fantasia M.D.   On: 05/13/2019 04:34   Leg [place]  Result Date: 05/16/2019  Lower Venous Study Indications: Elevated Ddimer.  Risk Factors: COVID 19 positive. Limitations: Poor ultrasound/tissue interface and patient immobility, patient positioning, poor patient cooperation. Comparison Study: No prior studies. Performing Technologist: Oliver Hum RVT  Examination Guidelines: A complete evaluation includes B-mode imaging, spectral Doppler, color Doppler, and power Doppler as needed of all accessible portions of each vessel. Bilateral testing is considered an integral part of a complete examination. Limited examinations for reoccurring indications may be performed as noted.   +---------+---------------+---------+-----------+----------+--------------+ RIGHT    CompressibilityPhasicitySpontaneityPropertiesThrombus Aging +---------+---------------+---------+-----------+----------+--------------+ CFV      Full           Yes      Yes                                 +---------+---------------+---------+-----------+----------+--------------+ SFJ      Full                                                        +---------+---------------+---------+-----------+----------+--------------+  FV Prox  Full                                                        +---------+---------------+---------+-----------+----------+--------------+ FV Mid   Full                                                        +---------+---------------+---------+-----------+----------+--------------+ FV DistalFull                                                        +---------+---------------+---------+-----------+----------+--------------+ PFV      Full                                                        +---------+---------------+---------+-----------+----------+--------------+ POP      Full           Yes      Yes                                 +---------+---------------+---------+-----------+----------+--------------+ PTV      Full                                                        +---------+---------------+---------+-----------+----------+--------------+ PERO     Full                                                        +---------+---------------+---------+-----------+----------+--------------+   +---------+---------------+---------+-----------+----------+--------------+ LEFT     CompressibilityPhasicitySpontaneityPropertiesThrombus Aging +---------+---------------+---------+-----------+----------+--------------+ CFV      Full           Yes      Yes                                  +---------+---------------+---------+-----------+----------+--------------+ SFJ      Full                                                        +---------+---------------+---------+-----------+----------+--------------+ FV Prox  Full                                                        +---------+---------------+---------+-----------+----------+--------------+  FV Mid   Full                                                        +---------+---------------+---------+-----------+----------+--------------+ FV DistalFull                                                        +---------+---------------+---------+-----------+----------+--------------+ PFV      Full                                                        +---------+---------------+---------+-----------+----------+--------------+ POP      Full           Yes      Yes                                 +---------+---------------+---------+-----------+----------+--------------+ PTV      Full                                                        +---------+---------------+---------+-----------+----------+--------------+ PERO     Full                                                        +---------+---------------+---------+-----------+----------+--------------+     Summary: Right: There is no evidence of deep vein thrombosis in the lower extremity. However, portions of this examination were limited- see technologist comments above. No cystic structure found in the popliteal fossa. Left: There is no evidence of deep vein thrombosis in the lower extremity. However, portions of this examination were limited- see technologist comments above. No cystic structure found in the popliteal fossa.  *See table(s) above for measurements and observations. Electronically signed by Deitra Mayo MD on 05/16/2019 at 3:45:52 PM.    Final

## 2019-05-20 NOTE — Progress Notes (Signed)
CSW notified [facility] of patient discharging today- [facility] to deliver o2 concentrator to patients address and will deliver portable tank if needed.  Stacy Gardner, LCSW Transitions of Care Department System Wide Float  [phone]

## 2019-05-20 NOTE — Care Management Important Message (Signed)
Important Message  Patient Details  Name: Adriana Fox MRN: [id number] Date of Birth: 1946/01/26   Medicare Important Message Given:  Yes - Important Message mailed due to current National Emergency  Verbal consent obtained due to current National Emergency  Relationship to patient: Child Contact Name: Shilo Philipson Call Date: 05/20/19  Time: 1107 Phone: [phone] Outcome: No Answer/Busy Important Message mailed to: Patient address on file    Orson Aloe 05/20/2019, 11:07 AM

## 2019-05-20 NOTE — Progress Notes (Signed)
PT Cancellation Note  Patient Details Name: Adriana Fox MRN: [id number] DOB: [birth year]   Cancelled Treatment:    Reason Eval/Treat Not Completed: Medical issues which prohibited therapy;Other (comment)(Per RN, requested that PT be held today secondary to O2 sats and general vitals have been unstable.)  Harriett Rush, PT # [phone] CGV cell  Ephriam Jenkins 05/20/2019, 12:26 PM

## 2019-05-21 LAB — COMPREHENSIVE METABOLIC PANEL
ALT: 44 U/L (ref 0–44)
AST: 30 U/L (ref 15–41)
Albumin: 2.9 g/dL — ABNORMAL LOW (ref 3.5–5.0)
Alkaline Phosphatase: 123 U/L (ref 38–126)
Anion gap: 11 (ref 5–15)
BUN: 19 mg/dL (ref 8–23)
CO2: 28 mmol/L (ref 22–32)
Calcium: 8.8 mg/dL — ABNORMAL LOW (ref 8.9–10.3)
Chloride: 102 mmol/L (ref 98–111)
Creatinine, Ser: 0.67 mg/dL (ref 0.44–1.00)
GFR calc Af Amer: >60 mL/min (ref >60)
GFR calc non Af Amer: >60 mL/min (ref >60)
Glucose, Bld: 85 mg/dL (ref 70–99)
Potassium: 3.7 mmol/L (ref 3.5–5.1)
Sodium: 141 mmol/L (ref 135–145)
Total Bilirubin: 0.9 mg/dL (ref 0.3–1.2)
Total Protein: 6 g/dL — ABNORMAL LOW (ref 6.5–8.1)

## 2019-05-21 LAB — GLUCOSE, CAPILLARY
Glucose-Capillary: 100 mg/dL — ABNORMAL HIGH (ref 70–99)
Glucose-Capillary: 121 mg/dL — ABNORMAL HIGH (ref 70–99)
Glucose-Capillary: 124 mg/dL — ABNORMAL HIGH (ref 70–99)
Glucose-Capillary: 124 mg/dL — ABNORMAL HIGH (ref 70–99)

## 2019-05-21 LAB — CBC WITH DIFFERENTIAL/PLATELET
Abs Immature Granulocytes: 0.03 10*3/uL (ref 0.00–0.07)
Basophils Absolute: 0 10*3/uL (ref 0.0–0.1)
Basophils Relative: 0 %
Eosinophils Absolute: 0.3 10*3/uL (ref 0.0–0.5)
Eosinophils Relative: 4 %
HCT: 41.5 % (ref 36.0–46.0)
Hemoglobin: 13.1 g/dL (ref 12.0–15.0)
Immature Granulocytes: 1 %
Lymphocytes Relative: 20 %
Lymphs Abs: 1.3 10*3/uL (ref 0.7–4.0)
MCH: 31 pg (ref 26.0–34.0)
MCHC: 31.6 g/dL (ref 30.0–36.0)
MCV: 98.1 fL (ref 80.0–100.0)
Monocytes Absolute: 0.7 10*3/uL (ref 0.1–1.0)
Monocytes Relative: 12 %
Neutro Abs: 3.9 10*3/uL (ref 1.7–7.7)
Neutrophils Relative %: 63 %
Platelets: 243 10*3/uL (ref 150–400)
RBC: 4.23 MIL/uL (ref 3.87–5.11)
RDW: 18.9 % — ABNORMAL HIGH (ref 11.5–15.5)
WBC: 6.2 10*3/uL (ref 4.0–10.5)
nRBC: 0 % (ref 0.0–0.2)

## 2019-05-21 LAB — PROCALCITONIN: Procalcitonin: <0.1 ng/mL

## 2019-05-21 LAB — C-REACTIVE PROTEIN: CRP: 0.8 mg/dL (ref <1.0)

## 2019-05-21 LAB — D-DIMER, QUANTITATIVE: D-Dimer, Quant: 4.31 ug/mL-FEU — ABNORMAL HIGH (ref 0.00–0.50)

## 2019-05-21 LAB — MAGNESIUM: Magnesium: 1.9 mg/dL (ref 1.7–2.4)

## 2019-05-21 LAB — BRAIN NATRIURETIC PEPTIDE: B Natriuretic Peptide: 145.2 pg/mL — ABNORMAL HIGH (ref 0.0–100.0)

## 2019-05-21 MED ORDER — HALOPERIDOL LACTATE 5 MG/ML IJ SOLN
1.0000 mg | Freq: Four times a day (QID) | INTRAMUSCULAR | Status: DC | PRN
  Administered 2019-05-21 – 2019-05-22 (×2): 1 mg via INTRAVENOUS
  Filled 2019-05-21 (×2): qty 1

## 2019-05-21 MED ORDER — QUETIAPINE FUMARATE 25 MG PO TABS
25.0000 mg | ORAL_TABLET | Freq: Every day | ORAL | Status: DC
  Administered 2019-05-21: 22:00:00 25 mg via ORAL
  Filled 2019-05-21: qty 1

## 2019-05-21 MED ORDER — LACTATED RINGERS IV SOLN: INTRAVENOUS | Status: AC

## 2019-05-21 NOTE — Progress Notes (Signed)
Occupational Therapy Treatment Patient Details Name: Adriana Fox MRN: [id number] DOB: Dec 05, 1945 Today's Date: 05/21/2019    History of present illness 74 year old with past medical history significant for dementia, hyperlipidemia, CVA with residual left-sided weakness, diagnosed with COVID-19 on 12/17.  Present with worsening shortness of breath, was diagnosed with acute hypoxic respiratory failure due to COVID-19 pneumonia and admitted to the hospital.   OT comments  Pt seen while on 4L. Poor pleth with SpO2 ranigng form 84-94, however pt did not appears to be in respiratory distress during session. Progressed to edge of bed with Mod A. Pt assisting with bathing at bed and seated level. Able to stand pivot to recliner with mod A with pt reaching for recliner (once pt understood plan). Pt able to feed self with min A. Tactile cues to increase PO intake. Pt able to manipulate utensils but would most likely due better with finger foods. Pt left in chair with seat belt alarm. Nsg educated on mobility status. Attempted to call daughter to discuss progress - no answer. Per MD note, daughter prefers her Mom DC to SNF. Pt will benefit from rehab at SNF. Will need 24/7 S.   Follow Up Recommendations  SNF;Supervision/Assistance - 24 hour; (MD notes states daughter wants SNF)    Equipment Recommendations  3 in 1 bedside commode    Recommendations for Other Services      Precautions / Restrictions Precautions Precautions: Fall       Mobility Bed Mobility Overal bed mobility: Needs Assistance       Supine to sit: Mod assist     General bed mobility comments: tactile cues to help pt understand movement  Transfers Overall transfer level: Needs assistance   Transfers: Sit to/from Stand;Stand Pivot Transfers Sit to Stand: Mod assist Stand pivot transfers: Mod assist       General transfer comment: Once pt understand plan to move to chair she states "OK", then asssited by reaching for  chair and helping pivot to chair    Balance Overall balance assessment: Needs assistance   Sitting balance-Leahy Scale: Fair       Standing balance-Leahy Scale: Poor                             ADL either performed or assessed with clinical judgement   ADL Overall ADL's : Needs assistance/impaired Eating/Feeding: Moderate assistance Eating/Feeding Details (indicate cue type and reason): at times pt able to scoop food onto utensil; pt able to bring food to mouth appropriately; able to manipulate utensil but does better with finger foods; cues at times to increase PO intake Grooming: Moderate assistance;Sitting Grooming Details (indicate cue type and reason): able to wash face after set up Upper Body Bathing: Moderate assistance;Bed level   Lower Body Bathing: Maximal assistance;Bed level   Upper Body Dressing : Moderate assistance Upper Body Dressing Details (indicate cue type and reason): Pt assisting by putting arms in sleeves                 Functional mobility during ADLs: Moderate assistance       Vision       Perception     Praxis      Cognition Arousal/Alertness: Awake/alert Behavior During Therapy: Restless;Impulsive Overall Cognitive Status: No family/caregiver present to determine baseline cognitive functioning Area of Impairment: Orientation;Attention;Memory;Following commands;Safety/judgement;Awareness;Problem solving                 Orientation Level: Disoriented to;Place;Time;Situation  Current Attention Level: Sustained Memory: Decreased short-term memory Following Commands: Follows one step commands inconsistently Safety/Judgement: Decreased awareness of safety;Decreased awareness of deficits Awareness: Intellectual Problem Solving: Slow processing;Decreased initiation;Difficulty sequencing;Requires verbal cues;Requires tactile cues          Exercises     Shoulder Instructions       General Comments      Pertinent  Vitals/ Pain       Pain Assessment: Faces Faces Pain Scale: Hurts a little bit Pain Location: L wrist Pain Descriptors / Indicators: Discomfort;Grimacing;Guarding Pain Intervention(s): Limited activity within patient's tolerance  Home Living                                          Prior Functioning/Environment              Frequency  Min 2X/week        Progress Toward Goals  OT Goals(current goals can now be found in the care plan section)  Progress towards OT goals: Progressing toward goals  Acute Rehab OT Goals Patient Stated Goal: pt unable to state Time For Goal Achievement: 05/30/19 Potential to Achieve Goals: Good ADL Goals Pt Will Perform Eating: with supervision;with set-up;sitting Pt Will Perform Grooming: with min assist;sitting Pt Will Perform Upper Body Bathing: with min assist;sitting Pt Will Transfer to Toilet: with min assist;bedside commode;stand pivot transfer  Plan Discharge plan remains appropriate    Co-evaluation                 AM-PAC OT "6 Clicks" Daily Activity     Outcome Measure   Help from another person eating meals?: A Lot Help from another person taking care of personal grooming?: A Lot Help from another person toileting, which includes using toliet, bedpan, or urinal?: Total Help from another person bathing (including washing, rinsing, drying)?: A Lot Help from another person to put on and taking off regular upper body clothing?: A Lot Help from another person to put on and taking off regular lower body clothing?: A Lot 6 Click Score: 11    End of Session    OT Visit Diagnosis: Unsteadiness on feet (R26.81);Other abnormalities of gait and mobility (R26.89);Muscle weakness (generalized) (M62.81);Other symptoms and signs involving cognitive function;Pain Pain - Right/Left: Left Pain - part of body: (wrist)   Activity Tolerance     Patient Left     Nurse Communication          Time: 4128-7867 OT  Time Calculation (min): 35 min  Charges: OT General Charges $OT Visit: 1 Visit OT Treatments $Self Care/Home Management : 23-37 mins  Luisa Dago, OT/L   Acute OT Clinical Specialist Acute Rehabilitation Services Pager [phone] Office [phone]    [facility] 05/21/2019, 2:52 PM

## 2019-05-21 NOTE — Progress Notes (Signed)
PROGRESS NOTE                                                                                                                                                                                                             Patient Demographics:    Adriana Fox, is a 74 y.o. female, DOB - 1945-05-22, QGB:[id number]  Outpatient Primary MD for the patient is Patient, No Pcp Per    LOS - 8  Admit date - 05/13/2019    Chief Complaint  Patient presents with  . Hypotension  . Weakness  . Shortness of Breath       Brief Narrative 74 year old with past medical history significant for dementia, hyperlipidemia, CVA with residual left-sided weakness, diagnosed with COVID-19 on 12/17.  Present with worsening shortness of breath, was diagnosed with acute hypoxic respiratory failure due to COVID-19 pneumonia and admitted to the hospital.   Subjective:   Patient in bed, appears comfortable, confused denies any headache chest or abdominal pain, denies shortness of breath.   Assessment  & Plan :    1. Acute Hypoxic Resp. Failure, Sepsis due to Acute Covid 19 Viral Pneumonitis during the ongoing 2020 Covid 19 Pandemic - she had moderate to severe disease along with sepsis and severe dehydration.  Sepsis pathophysiology has resolved, she has been treated with IV steroids and remdesivir with good effect.  Process pathophysiology has resolved, note her oxygen readings are extremely dependent on her body position and oxygen probe, multiple times her probe has been adjusted with oxygen requirement less than 2 L however if she changes posture or the probe is pulled her pulse ox drops prompting the staff to increase her oxygen flow rate, staff has been informed multiple times over several days.  Encouraged the patient to sit up in chair in the daytime use I-S and flutter valve for pulmonary toiletry and then prone in bed when at night.  SpO2: 90 % O2  Flow Rate (L/min): 4 L/min   Recent Labs  Lab 05/15/19 0258 05/15/19 0258 05/16/19 0654 05/17/19 0057 05/18/19 0530 05/19/19 0600 05/20/19 0443 05/21/19 0112  CRP 3.5*  --  3.0* 2.0* 3.2* 2.0* 1.0* 0.8  DDIMER >20.00*  --  16.24* 13.34* 8.51* 6.90* 5.34* 4.31*  FERRITIN 303  --   --   --   --   --   --   --  BNP  --    < > 482.3* 519.5* 413.5* 174.4* 189.6* 145.2*  PROCALCITON 0.28  --  0.39  --   --  0.19 <0.10 <0.10   < > = values in this interval not displayed.    Hepatic Function Latest Ref Rng & Units 05/21/2019 05/20/2019 05/19/2019  Total Protein 6.5 - 8.1 g/dL 6.0(L) 5.7(L) 6.4(L)  Albumin 3.5 - 5.0 g/dL 2.9(L) 2.7(L) 3.2(L)  AST 15 - 41 U/L 30 39 34  ALT 0 - 44 U/L 44 41 48(H)  Alk Phosphatase 38 - 126 U/L 123 119 132(H)  Total Bilirubin 0.3 - 1.2 mg/dL 0.9 0.9 1.1      2.  COVID-19 viral illness induced transaminitis.  Symptom-free, trend stable and almost completely resolved.  Is on statin monitor closely.  3.  Extremely elevated D-dimer.  CTA lungs and lower extremity and leg ultrasound unremarkable, D-dimer is now trending down on full dose Lovenox and will switch to high dose prophylactic Lovenox on 05/17/2019.  Continue to monitor D-dimer closely.  4.  Hypotension and poor oral intake.  Gentle IV fluid hydration on 05/17/2019 and monitor closely.  5.  Dementia, CVA, left-sided hemiparesis.  Supportive care.  At risk for delirium, avoid benzodiazepines and narcotics.  Continue Plavix for secondary prevention along with statin.  6.  Toxic and metabolic encephalopathy due to COVID-19 infection in the setting of dementia and CVA.  Per daughter she uses trazodone twice daily to reduce her confusion and anxiety, she has been placed on home dose trazodone, overall stable.  7.  Mild hypernatremia and hypokalemia.  resolved after D5W and KCl    Condition -   Fair  Family Communication  : Daughter over the phone on 05/15/2019, message left again on 05/19/2019  Code Status :  Full  Diet :   Diet Order            Diet Heart Room service appropriate? Yes with Assist; Fluid consistency: Thin  Diet effective now               Disposition Plan  : Caryl Pina daughter wanted her to come home but now wants SNF  Consults  : None  Procedures  :  CTA.  No PE.  Leg ultrasound.  Limited but negative study.  PUD Prophylaxis : None  DVT Prophylaxis  :  Lovenox  High dose  Lab Results  Component Value Date   PLT 243 05/21/2019    Inpatient Medications  Scheduled Meds: . albuterol  2 puff Inhalation Q6H  . vitamin C  500 mg Oral Daily  . atorvastatin  40 mg Oral Daily  . clopidogrel  75 mg Oral Daily  . dexamethasone  1 mg Oral Daily  . enoxaparin (LOVENOX) injection  30 mg Subcutaneous Q12H  . famotidine  20 mg Oral BID  . memantine  5 mg Oral Daily  . sodium chloride flush  3 mL Intravenous Q12H  . traZODone  25 mg Oral BID  . zinc sulfate  220 mg Oral Daily   Continuous Infusions:  PRN Meds:.guaiFENesin-dextromethorphan  Antibiotics  :    Anti-infectives (From admission, onward)   Start     Dose/Rate Route Frequency Ordered Stop   05/17/19 1600  remdesivir 100 mg in sodium chloride 0.9 % 100 mL IVPB     100 mg 200 mL/hr over 30 Minutes Intravenous Daily 05/17/19 1558 05/17/19 1716   05/14/19 1500  vancomycin (VANCOCIN) IVPB 1000 mg/200 mL premix  Status:  Discontinued  1,000 mg 200 mL/hr over 60 Minutes Intravenous Every 24 hours 05/14/19 1354 05/15/19 1253   05/14/19 1000  remdesivir 100 mg in sodium chloride 0.9 % 100 mL IVPB  Status:  Discontinued     100 mg 200 mL/hr over 30 Minutes Intravenous Daily 05/13/19 1730 05/17/19 1102   05/13/19 1745  remdesivir 200 mg in sodium chloride 0.9% 250 mL IVPB     200 mg 580 mL/hr over 30 Minutes Intravenous Once 05/13/19 1730 05/13/19 2035       Time Spent in minutes  30   Lala Lund M.D on 05/21/2019 at 10:18 AM  To page go to [email or web address] - password [facility]  [facility] -   Office  [phone]     See all Orders from today for further details    Objective:   Vitals:   05/20/19 1935 05/21/19 0445 05/21/19 0620 05/21/19 0748  BP: 101/88 103/75  100/62  Pulse: (!) 109 72 87 91  Resp: _0 Temp: 98.5 F (36.9 C) 98.4 F (36.9 C)  98 F (36.7 C)  TempSrc: Oral Axillary  Axillary  SpO2: 90% 97% 95% 90%  Weight:      Height:        Wt Readings from Last 3 Encounters:  05/13/19 50.1 kg     Intake/Output Summary (Last 24 hours) at 05/21/2019 1018 Last data filed at 05/21/2019 0934 Gross per 24 hour  Intake 1140 ml  Output 1400 ml  Net -260 ml     Physical Exam  Awake but pleasantly confused, appears to be in no distress and mild left-sided weakness which is chronic Freer.AT,PERRAL Supple Neck,No JVD, No cervical lymphadenopathy appriciated.  Symmetrical Chest wall movement, Good air movement bilaterally, CTAB RRR,No Gallops, Rubs or new Murmurs, No Parasternal Heave +ve B.Sounds, Abd Soft, No tenderness, No organomegaly appriciated, No rebound - guarding or rigidity. No Cyanosis, Clubbing or edema, No new Rash or bruise    Data Review:    CBC Recent Labs  Lab 05/17/19 0057 05/18/19 0530 05/19/19 0600 05/20/19 0443 05/21/19 0112  WBC 9.1 7.3 7.9 6.9 6.2  HGB 11.4* 12.0 13.4 12.5 13.1  HCT 35.3* 37.6 42.2 39.2 41.5  PLT 164 197 215 PLATELET CLUMPS NOTED ON SMEAR, UNABLE TO ESTIMATE 243  MCV 97.8 97.2 98.6 98.0 98.1  MCH 31.6 31.0 31.3 31.3 31.0  MCHC 32.3 31.9 31.8 31.9 31.6  RDW 18.6* 19.3* 19.7* 18.9* 18.9*  LYMPHSABS 1.0 1.5 2.0 2.2 1.3  MONOABS 0.7 0.7 0.9 0.7 0.7  EOSABS 0.0 0.0 0.3 0.3 0.3  BASOSABS 0.0 0.0 0.0 0.0 0.0    Chemistries  Recent Labs  Lab 05/17/19 0057 05/18/19 0530 05/19/19 0600 05/20/19 0443 05/21/19 0112  NA 142 145 146* 139 141  K 3.8 3.8 3.3* 4.6 3.7  CL 114* 112* 109 106 102  CO2 19* _1 GLUCOSE 109* 70 75 59* 85  BUN _2 CREATININE 0.63 0.56 0.71 0.60 0.67    CALCIUM 8.4* 8.6* 9.1 8.4* 8.8*  MG 2.1 2.1 2.1 2.0 1.9  AST 42* 29 34 39 30  ALT 52* 43 48* 41 44  ALKPHOS 142* 130* 132* 119 123  BILITOT 0.5 1.1 1.1 0.9 0.9   ------------------------------------------------------------------------------------------------------------------ No results for input(s): CHOL, HDL, LDLCALC, TRIG, CHOLHDL, LDLDIRECT in the last 72 hours.  No results found for: HGBA1C ------------------------------------------------------------------------------------------------------------------ No results for input(s): TSH, T4TOTAL, T3FREE, THYROIDAB in the last 72 hours.  Invalid  input(s): FREET3  Cardiac Enzymes No results for input(s): CKMB, TROPONINI, MYOGLOBIN in the last 168 hours.  Invalid input(s): CK ------------------------------------------------------------------------------------------------------------------    Component Value Date/Time   BNP 145.2 (H) 05/21/2019 0112    Micro Results Recent Results (from the past 240 hour(s))  Blood Culture (routine x 2)     Status: Abnormal   Collection Time: 05/13/19  4:22 AM   Specimen: BLOOD  Result Value Ref Range Status   Specimen Description BLOOD LEFT ANTECUBITAL  Final   Special Requests   Final    BOTTLES DRAWN AEROBIC AND ANAEROBIC Blood Culture adequate volume   Culture  Setup Time   Final    IN BOTH AEROBIC AND ANAEROBIC BOTTLES GRAM POSITIVE COCCI CRITICAL RESULT CALLED TO, READ BACK BY AND VERIFIED WITH: J LEDFORD PHARMD 05/13/19 2240 JDW    Culture (A)  Final    STAPHYLOCOCCUS SPECIES (COAGULASE NEGATIVE) THE SIGNIFICANCE OF ISOLATING THIS ORGANISM FROM A SINGLE SET OF BLOOD CULTURES WHEN MULTIPLE SETS ARE DRAWN IS UNCERTAIN. PLEASE NOTIFY THE MICROBIOLOGY DEPARTMENT WITHIN ONE WEEK IF SPECIATION AND SENSITIVITIES ARE REQUIRED. Performed at [facility], [place] [address] [place], [place] [address]    Report Status 05/15/2019 FINAL  Final  Blood Culture (routine x 2)     Status: None    Collection Time: 05/13/19  4:22 AM   Specimen: BLOOD  Result Value Ref Range Status   Specimen Description BLOOD RIGHT ANTECUBITAL  Final   Special Requests   Final    BOTTLES DRAWN AEROBIC AND ANAEROBIC Blood Culture adequate volume   Culture   Final    NO GROWTH 5 DAYS Performed at [facility], [place] [address], [place], [place] [address]    Report Status 05/18/2019 FINAL  Final  SARS CORONAVIRUS 2 (TAT 6-24 HRS) Nasopharyngeal Nasopharyngeal Swab     Status: Abnormal   Collection Time: 05/13/19  5:26 AM   Specimen: Nasopharyngeal Swab  Result Value Ref Range Status   SARS Coronavirus 2 POSITIVE (A) NEGATIVE Final    Comment: emailed L. Berdik RN 14:15 05/13/19 ([email or web address]) (NOTE) SARS-CoV-2 target nucleic acids are DETECTED. The SARS-CoV-2 RNA is generally detectable in upper and lower respiratory specimens during the acute phase of infection. Positive results are indicative of the presence of SARS-CoV-2 RNA. Clinical correlation with patient history and other diagnostic information is  necessary to determine patient infection status. Positive results do not rule out bacterial infection or co-infection with other viruses.  The expected result is Negative. Fact Sheet for Patients: SugarRoll.be Fact Sheet for Healthcare Providers: https://www.woods-mathews.com/ This test is not yet approved or cleared by the [place] FDA and  has been authorized for detection and/or diagnosis of SARS-CoV-2 by FDA under an Emergency Use Authorization (EUA). This EUA will remain  in effect (meaning this test can be used) for the duration of the COVID-19 declaration un der Section 564(b)(1) of the Act, 21 U.S.C. section 360bbb-3(b)(1), unless the authorization is terminated or revoked sooner. Performed at [facility], [place] [address] [place], [place] [address]     Radiology Reports CT Angio Chest PE W and/or Wo Contrast  Result Date:  05/13/2019 CLINICAL DATA:  Show less of breath and positive D-dimer EXAM: CT ANGIOGRAPHY CHEST WITH CONTRAST TECHNIQUE: Multidetector CT imaging of the chest was performed using the standard protocol during bolus administration of intravenous contrast. Multiplanar CT image reconstructions and MIPs were obtained to evaluate the vascular anatomy. CONTRAST:  139m OMNIPAQUE IOHEXOL 350  MG/ML SOLN COMPARISON:  None. FINDINGS: Cardiovascular: Overall normal heart size but asymmetric dilatation of the right heart. Pulmonary artery visualization is degraded by motion artifact there is diagnostic opacification and visualization to the segmental level throughout the majority of the lungs. No filling defect is seen. There is streaming artifact in the left main pulmonary artery. Mild atherosclerotic calcification of the aorta. Mediastinum/Nodes: Paucity of mediastinal fat with no suspected adenopathy or mass. Lungs/Pleura: Subpleural opacity with coarse reticular appearance. Some of this may be chronic as there is suggestion of some honeycombing and traction bronchiectasis. Dependent atelectasis. No edema, effusion, or pneumothorax. Upper Abdomen: Bilateral hepatic cystic densities. Early opacification of the aorta. Musculoskeletal: No acute or aggressive finding. Review of the MIP images confirms the above findings. IMPRESSION: 1. Interstitial opacities likely related to history of COVID 19. There may be underlying interstitial lung disease. 2. Probable right heart dilatation but no evidence of pulmonary embolism. Electronically Signed   By: Monte Fantasia M.D.   On: 05/13/2019 08:26   DG Chest Port 1 View  Result Date: 05/18/2019 CLINICAL DATA:  Shortness of breath. Additional history provided: Diagnosed with COVID-19 on 12/17. Worsening shortness of breath, hypoxic respiratory failure due to COVID pneumonia. EXAM: PORTABLE CHEST 1 VIEW COMPARISON:  CT angiogram chest 05/13/2019, chest radiograph 05/13/2019 FINDINGS:  Heart size at the upper limits of normal. Redemonstrated bilateral interstitial opacities. Superimposed patchy airspace opacities most notable in the left lung base. Underlying interstitial lung disease is again questioned. Overall, findings are similar to prior chest radiograph 05/13/2019. No sizable pleural effusion or evidence of pneumothorax. No acute bony abnormality. IMPRESSION: Bilateral interstitial prominence. Superimposed patchy airspace opacities most notable in the left lower lobe. Overall, findings are similar to prior chest radiograph 05/13/2019 and likely reflect multifocal pneumonia. A background of chronic interstitial lung disease is again questioned. Electronically Signed   By: Kellie Simmering DO   On: 05/18/2019 15:10   DG Chest Port 1 View  Result Date: 05/13/2019 CLINICAL DATA:  Shortness of breath.  COVID-19 positive EXAM: PORTABLE CHEST 1 VIEW COMPARISON:  04/07/2018 FINDINGS: Diffuse interstitial coarsening with patchy airspace type opacities at the left more than right base. No effusion or pneumothorax. Normal heart size and stable aortic contours. Artifact from EKG leads IMPRESSION: Patchy airspace and interstitial opacity correlating with history of COVID-19. Electronically Signed   By: Monte Fantasia M.D.   On: 05/13/2019 04:34   Leg [place]  Result Date: 05/16/2019  Lower Venous Study Indications: Elevated Ddimer.  Risk Factors: COVID 19 positive. Limitations: Poor ultrasound/tissue interface and patient immobility, patient positioning, poor patient cooperation. Comparison Study: No prior studies. Performing Technologist: Oliver Hum RVT  Examination Guidelines: A complete evaluation includes B-mode imaging, spectral Doppler, color Doppler, and power Doppler as needed of all accessible portions of each vessel. Bilateral testing is considered an integral part of a complete examination. Limited examinations for reoccurring indications may be performed as noted.   +---------+---------------+---------+-----------+----------+--------------+ RIGHT    CompressibilityPhasicitySpontaneityPropertiesThrombus Aging +---------+---------------+---------+-----------+----------+--------------+ CFV      Full           Yes      Yes                                 +---------+---------------+---------+-----------+----------+--------------+ SFJ      Full                                                        +---------+---------------+---------+-----------+----------+--------------+  FV Prox  Full                                                        +---------+---------------+---------+-----------+----------+--------------+ FV Mid   Full                                                        +---------+---------------+---------+-----------+----------+--------------+ FV DistalFull                                                        +---------+---------------+---------+-----------+----------+--------------+ PFV      Full                                                        +---------+---------------+---------+-----------+----------+--------------+ POP      Full           Yes      Yes                                 +---------+---------------+---------+-----------+----------+--------------+ PTV      Full                                                        +---------+---------------+---------+-----------+----------+--------------+ PERO     Full                                                        +---------+---------------+---------+-----------+----------+--------------+   +---------+---------------+---------+-----------+----------+--------------+ LEFT     CompressibilityPhasicitySpontaneityPropertiesThrombus Aging +---------+---------------+---------+-----------+----------+--------------+ CFV      Full           Yes      Yes                                  +---------+---------------+---------+-----------+----------+--------------+ SFJ      Full                                                        +---------+---------------+---------+-----------+----------+--------------+ FV Prox  Full                                                        +---------+---------------+---------+-----------+----------+--------------+  FV Mid   Full                                                        +---------+---------------+---------+-----------+----------+--------------+ FV DistalFull                                                        +---------+---------------+---------+-----------+----------+--------------+ PFV      Full                                                        +---------+---------------+---------+-----------+----------+--------------+ POP      Full           Yes      Yes                                 +---------+---------------+---------+-----------+----------+--------------+ PTV      Full                                                        +---------+---------------+---------+-----------+----------+--------------+ PERO     Full                                                        +---------+---------------+---------+-----------+----------+--------------+     Summary: Right: There is no evidence of deep vein thrombosis in the lower extremity. However, portions of this examination were limited- see technologist comments above. No cystic structure found in the popliteal fossa. Left: There is no evidence of deep vein thrombosis in the lower extremity. However, portions of this examination were limited- see technologist comments above. No cystic structure found in the popliteal fossa.  *See table(s) above for measurements and observations. Electronically signed by Deitra Mayo MD on 05/16/2019 at 3:45:52 PM.    Final

## 2019-05-21 NOTE — Progress Notes (Signed)
Occupational Therapy Treatment Note  Assisted pt back to bed. Pt given increased time to process information regarding getting back to bed. Pt initiated stand with mod A and able to take 2 steps to bed. Increased RR during mobility; difficulty monitoring O2 Sats however pt able to verbalize without difficulty. Once in upright position in bed, pt drank 3/4 of chocolate ensure. Recommend rehab at [facility].    05/21/19 1600  OT Visit Information  Last OT Received On 05/21/19  Assistance Needed +2 (for ambulation)  History of Present Illness 74 year old with past medical history significant for dementia, hyperlipidemia, CVA with residual left-sided weakness, diagnosed with COVID-19 on 12/17.  Present with worsening shortness of breath, was diagnosed with acute hypoxic respiratory failure due to COVID-19 pneumonia and admitted to the hospital.  Precautions  Precautions Fall  Pain Assessment  Pain Assessment Faces  Faces Pain Scale 2  Pain Location L wrist  Pain Descriptors / Indicators Discomfort;Grimacing;Guarding  Pain Intervention(s) Limited activity within patient's tolerance  Cognition  Arousal/Alertness Awake/alert  Behavior During Therapy Restless;Impulsive  Overall Cognitive Status No family/caregiver present to determine baseline cognitive functioning  Area of Impairment Orientation;Attention;Memory;Following commands;Safety/judgement;Awareness;Problem solving  Orientation Level Disoriented to;Place;Time;Situation  Current Attention Level Sustained  Memory Decreased short-term memory  Following Commands Follows one step commands inconsistently  Safety/Judgement Decreased awareness of safety;Decreased awareness of deficits  Awareness Intellectual  Problem Solving Slow processing;Decreased initiation;Difficulty sequencing;Requires verbal cues;Requires tactile cues  Upper Extremity Assessment  Upper Extremity Assessment Generalized weakness;LUE deficits/detail  LUE Deficits / Details  residual L hemiparesis  Lower Extremity Assessment  Lower Extremity Assessment Defer to PT evaluation  ADL  Overall ADL's  Needs assistance/impaired  Eating/Feeding Details (indicate cue type and reason) pt drinking when handed a cup; drank 3/4 cup of chocolate ensure  Bed Mobility  Overal bed mobility Needs Assistance  Sit to supine Mod assist  Balance  Sitting balance-Leahy Scale Fair  Standing balance-Leahy Scale Poor  Transfers  Overall transfer level Needs assistance  Transfers Sit to/from Stand;Stand Pivot Transfers  Sit to Stand Mod assist  Stand pivot transfers Mod assist  General transfer comment posterior lean  OT - End of Session  Activity Tolerance Patient tolerated treatment well  Patient left in bed;with call bell/phone within reach;with bed alarm set  Nurse Communication Mobility status  OT Assessment/Plan  OT Plan Discharge plan remains appropriate  OT Visit Diagnosis Unsteadiness on feet (R26.81);Other abnormalities of gait and mobility (R26.89);Muscle weakness (generalized) (M62.81);Other symptoms and signs involving cognitive function;Pain  Pain - Right/Left Left  Pain - part of body  (wrist)  OT Frequency (ACUTE ONLY) Min 2X/week  Follow Up Recommendations SNF;Supervision/Assistance - 24 hour;Home health OT  OT Equipment 3 in 1 bedside commode  AM-PAC OT "6 Clicks" Daily Activity Outcome Measure (Version 2)  Help from another person eating meals? 2  Help from another person taking care of personal grooming? 2  Help from another person toileting, which includes using toliet, bedpan, or urinal? 1  Help from another person bathing (including washing, rinsing, drying)? 2  Help from another person to put on and taking off regular upper body clothing? 2  Help from another person to put on and taking off regular lower body clothing? 2  6 Click Score 11  OT Goal Progression  Progress towards OT goals Progressing toward goals  Acute Rehab OT Goals  Patient Stated  Goal pt unable to state  Time For Goal Achievement 05/30/19  Potential to Achieve Goals Good  ADL Goals  Pt Will Perform Eating with supervision;with set-up;sitting  Pt Will Perform Grooming with min assist;sitting  Pt Will Perform Upper Body Bathing with min assist;sitting  Pt Will Transfer to Toilet with min assist;bedside commode;stand pivot transfer  OT Time Calculation  OT Start Time (ACUTE ONLY) 1545  OT Stop Time (ACUTE ONLY) 1601  OT Time Calculation (min) 16 min  OT General Charges  $OT Visit 1 Visit  OT Treatments  $Self Care/Home Management  8-22 mins  Luisa Dago, OT/L   Acute OT Clinical Specialist Acute Rehabilitation Services Pager [phone] Office [phone]

## 2019-05-21 NOTE — Progress Notes (Signed)
PT Cancellation Note  Patient Details Name: Adriana Fox MRN: [id number] DOB: [birth year]   Cancelled Treatment:    Reason Eval/Treat Not Completed: Other (comment)(Patient restless/ mildly agitated and resistant to PT attempting to treat.)  Harriett Rush, PT # [phone] CGV cell  Ephriam Jenkins 05/21/2019, 4:18 PM

## 2019-05-21 NOTE — Plan of Care (Signed)
  Problem: Education: Goal: Knowledge of risk factors and measures for prevention of condition will improve Outcome: Progressing   Problem: Coping: Goal: Psychosocial and spiritual needs will be supported Outcome: Progressing   Problem: Respiratory: Goal: Will maintain a patent airway Outcome: Progressing Goal: Complications related to the disease process, condition or treatment will be avoided or minimized Outcome: Progressing   

## 2019-05-21 NOTE — Progress Notes (Signed)
@  1935, spoke to Dr Dwana Curd, aware of pts heart rate fluctuating from 80s to 120s. Pt is anxious, pulling off nasal cannula, desats quickly when off. This RN w/reassuring words helped pt calm down slightly. Pts is to be Medical status, no tele, but continuing to monitor pulse ox. BP normal (pts baseline) of 95/76, MAP of 83. As per Dr Dwana Curd, EKG to be done stat. This RN performed EKG as ordered, difficult to keep pt from moving around and pulling at things.  Test was performed and recorded into chart. Will await Dr Dwana Curd return call with advise and request something for pts anxiety.

## 2019-05-21 NOTE — Progress Notes (Signed)
Pt continues to be anxious, pulling off oxygen, putting [place] in her mouth at times. Desaturates quickly. As per md orders, Haldol 1mg  IVP given, will monitor effectiveness and report.

## 2019-05-22 ENCOUNTER — Inpatient Hospital Stay (HOSPITAL_COMMUNITY): Payer: Medicare Other

## 2019-05-22 LAB — CBC WITH DIFFERENTIAL/PLATELET
Abs Immature Granulocytes: 0.02 10*3/uL (ref 0.00–0.07)
Basophils Absolute: 0 10*3/uL (ref 0.0–0.1)
Basophils Relative: 0 %
Eosinophils Absolute: 0.2 10*3/uL (ref 0.0–0.5)
Eosinophils Relative: 3 %
HCT: 39 % (ref 36.0–46.0)
Hemoglobin: 12.5 g/dL (ref 12.0–15.0)
Immature Granulocytes: 0 %
Lymphocytes Relative: 19 %
Lymphs Abs: 1.4 10*3/uL (ref 0.7–4.0)
MCH: 31.8 pg (ref 26.0–34.0)
MCHC: 32.1 g/dL (ref 30.0–36.0)
MCV: 99.2 fL (ref 80.0–100.0)
Monocytes Absolute: 0.8 10*3/uL (ref 0.1–1.0)
Monocytes Relative: 12 %
Neutro Abs: 4.6 10*3/uL (ref 1.7–7.7)
Neutrophils Relative %: 66 %
Platelets: 236 10*3/uL (ref 150–400)
RBC: 3.93 MIL/uL (ref 3.87–5.11)
RDW: 18.6 % — ABNORMAL HIGH (ref 11.5–15.5)
WBC: 7 10*3/uL (ref 4.0–10.5)
nRBC: 0 % (ref 0.0–0.2)

## 2019-05-22 LAB — D-DIMER, QUANTITATIVE: D-Dimer, Quant: 2.76 ug/mL-FEU — ABNORMAL HIGH (ref 0.00–0.50)

## 2019-05-22 LAB — COMPREHENSIVE METABOLIC PANEL
ALT: 40 U/L (ref 0–44)
AST: 29 U/L (ref 15–41)
Albumin: 2.6 g/dL — ABNORMAL LOW (ref 3.5–5.0)
Alkaline Phosphatase: 105 U/L (ref 38–126)
Anion gap: 9 (ref 5–15)
BUN: 17 mg/dL (ref 8–23)
CO2: 27 mmol/L (ref 22–32)
Calcium: 8.6 mg/dL — ABNORMAL LOW (ref 8.9–10.3)
Chloride: 105 mmol/L (ref 98–111)
Creatinine, Ser: 0.48 mg/dL (ref 0.44–1.00)
GFR calc Af Amer: >60 mL/min (ref >60)
GFR calc non Af Amer: >60 mL/min (ref >60)
Glucose, Bld: 89 mg/dL (ref 70–99)
Potassium: 4.1 mmol/L (ref 3.5–5.1)
Sodium: 141 mmol/L (ref 135–145)
Total Bilirubin: 0.7 mg/dL (ref 0.3–1.2)
Total Protein: 5.4 g/dL — ABNORMAL LOW (ref 6.5–8.1)

## 2019-05-22 LAB — C-REACTIVE PROTEIN: CRP: 0.5 mg/dL (ref <1.0)

## 2019-05-22 LAB — GLUCOSE, CAPILLARY
Glucose-Capillary: 100 mg/dL — ABNORMAL HIGH (ref 70–99)
Glucose-Capillary: 118 mg/dL — ABNORMAL HIGH (ref 70–99)
Glucose-Capillary: 122 mg/dL — ABNORMAL HIGH (ref 70–99)
Glucose-Capillary: 124 mg/dL — ABNORMAL HIGH (ref 70–99)
Glucose-Capillary: 84 mg/dL (ref 70–99)

## 2019-05-22 LAB — BRAIN NATRIURETIC PEPTIDE: B Natriuretic Peptide: 232.8 pg/mL — ABNORMAL HIGH (ref 0.0–100.0)

## 2019-05-22 LAB — MAGNESIUM: Magnesium: 1.9 mg/dL (ref 1.7–2.4)

## 2019-05-22 NOTE — Discharge Instructions (Signed)
Follow with Primary MD  in 7 days   Get CBC, CMP, 2 view Chest X ray -  checked next visit within 1 week by Primary MD or SNF MD   Activity: As tolerated with Full fall precautions use walker/cane & assistance as needed  Disposition SNF  Diet: Heart Healthy  with feeding assistance and aspiration precautions.  Special Instructions: If you have smoked or chewed Tobacco  in the last 2 yrs please stop smoking, stop any regular Alcohol  and or any Recreational drug use.  On your next visit with your primary care physician please Get Medicines reviewed and adjusted.  Please request your Prim.MD to go over all Hospital Tests and Procedure/Radiological results at the follow up, please get all Hospital records sent to your Prim MD by signing hospital release before you go home.  If you experience worsening of your admission symptoms, develop shortness of breath, life threatening emergency, suicidal or homicidal thoughts you must seek medical attention immediately by calling 911 or calling your MD immediately  if symptoms less severe.  You Must read complete instructions/literature along with all the possible adverse reactions/side effects for all the Medicines you take and that have been prescribed to you. Take any new Medicines after you have completely understood and accpet all the possible adverse reactions/side effects.       

## 2019-05-22 NOTE — Progress Notes (Signed)
CBG checked at 0600, results of 84.  Pt resting, arousable, slightly frustrated with awakening. This RN made pt comfortable and allowed for rest.

## 2019-05-22 NOTE — Progress Notes (Signed)
Noted effective results with 1mg  of Haldol. Pt sleeping quietly, arousable, much less anxious when awakes, tolerant of RN care and attention improved with above mentioned med.

## 2019-05-22 NOTE — Discharge Summary (Addendum)
Adriana Fox [id number] DOB: 04/30/1946 DOA: 05/13/2019  PCP: Patient, No Pcp Per  Admit date: 05/13/2019  Discharge date: 05/23/2019  Admitted From: Home   Disposition:  SNF   Recommendations for Outpatient Follow-up:   Follow up with PCP in 1-2 weeks  PCP Please obtain BMP/CBC, 2 view CXR in 1week,  (see Discharge instructions)   PCP Please follow up on the following pending results:    Home Health: None   Equipment/Devices: None  Consultations: None  Discharge Condition: Stable    CODE STATUS: Full    Diet Recommendation: Heart Healthy     Chief Complaint  Patient presents with  . Hypotension  . Weakness  . Shortness of Breath     Brief history of present illness from the day of admission and additional interim summary    74 year old with past medical history significant for dementia, hyperlipidemia, CVA with residual left-sided weakness, diagnosed with COVID-19 on 12/17. Present with worsening shortness of breath, was diagnosed with acute hypoxic respiratory failure due to COVID-19 pneumonia and admitted to the hospital.                                                                 Hospital Course    1. Acute Hypoxic Resp. Failure, Sepsis due to Acute Covid 19 Viral Pneumonitis during the ongoing 2020 Covid 19 Pandemic - she had moderate to severe disease along with sepsis and severe dehydration.  Sepsis pathophysiology has resolved, she has been treated with IV steroids and remdesivir with good effect.  She has completed the course of her COVID-19 pneumonia treatment and her sepsis pathophysiology has completely resolved, note her oxygen readings are extremely dependent on her body position and oxygen probe, multiple times her probe has been adjusted with oxygen requirement less than 2 L however  if she changes posture or the probe is pulled her pulse ox drops.  She is currently on room air and completely symptom-free, pulse ox is 92%.  Staff informed.  Continue to use nasal cannula oxygen 1 to 2 L/min as needed as needed at SNF.  SpO2: 92 % O2 Flow Rate (L/min): 0 L/min    Recent Labs  Lab 05/19/19 0600 05/20/19 0443 05/21/19 0112 05/22/19 0117 05/23/19 0133  CRP 2.0* 1.0* 0.8 0.5 0.6  DDIMER 6.90* 5.34* 4.31* 2.76* 3.62*  BNP 174.4* 189.6* 145.2* 232.8* 266.6*  PROCALCITON 0.19 <0.10 <0.10  --   --     Hepatic Function Latest Ref Rng & Units 05/23/2019 05/22/2019 05/21/2019  Total Protein 6.5 - 8.1 g/dL 6.0(L) 5.4(L) 6.0(L)  Albumin 3.5 - 5.0 g/dL 2.9(L) 2.6(L) 2.9(L)  AST 15 - 41 U/L 54(H) 29 30  ALT 0 - 44 U/L 68(H) 40 44  Alk Phosphatase 38 - 126 U/L 118 105 123  Total Bilirubin  0.3 - 1.2 mg/dL 0.8 0.7 0.9    2.  COVID-19 viral illness induced transaminitis.  Symptom-free, trend stable and almost completely resolved.  Is on statin monitor closely.  3.  Extremely elevated D-dimer.  CTA lungs and lower extremity and leg ultrasound unremarkable, D-dimer is now trending down on full dose Lovenox and will switch to high dose prophylactic Lovenox on 05/17/2019.    D-dimer has trended down nicely now.  4.  Hypotension and poor oral intake.    Was hydrated as needed with IV fluids here.  5.  Dementia, CVA, left-sided hemiparesis.  Supportive care.  At risk for delirium, avoid benzodiazepines and narcotics.  Continue Plavix for secondary prevention along with statin.  6.  Toxic and metabolic encephalopathy due to COVID-19 infection in the setting of dementia and CVA.  Per daughter she uses trazodone twice daily to reduce her confusion and anxiety, she has been placed on home dose trazodone, overall stable.  7.  Mild hypernatremia and hypokalemia.  resolved after D5W and KCl   At baseline patient is very frail, elderly, demented with encephalopathy.  Poor oral intake.   Her long-term prognosis is poor.  I have tried to educate the family about this, please continue to have goals of care discussions in the future.  If she declines I will strongly encourage palliative care and hospice.  Message left for daughter again on 05/23/2019 early in the morning.    Discharge diagnosis     Principal Problem:   Sepsis ([place]) Active Problems:   Pneumonia due to COVID-19 virus   Hypokalemia   Dementia (HCC)   Prolonged QT interval   History of CVA with residual deficit   Dehydration    Discharge instructions    Discharge Instructions    Diet - low sodium heart healthy   Complete by: As directed    Discharge instructions   Complete by: As directed    Follow with Primary MD  in 7 days   Get CBC, CMP, 2 view Chest X ray -  checked next visit within 1 week by Primary MD or SNF MD    Activity: As tolerated with Full fall precautions use walker/cane & assistance as needed  Disposition SNF  Diet: Heart Healthy   with feeding assistance and aspiration precautions.  Special Instructions: If you have smoked or chewed Tobacco  in the last 2 yrs please stop smoking, stop any regular Alcohol  and or any Recreational drug use.  On your next visit with your primary care physician please Get Medicines reviewed and adjusted.  Please request your Prim.MD to go over all Hospital Tests and Procedure/Radiological results at the follow up, please get all Hospital records sent to your Prim MD by signing hospital release before you go home.  If you experience worsening of your admission symptoms, develop shortness of breath, life threatening emergency, suicidal or homicidal thoughts you must seek medical attention immediately by calling 911 or calling your MD immediately  if symptoms less severe.  You Must read complete instructions/literature along with all the possible adverse reactions/side effects for all the Medicines you take and that have been prescribed to you. Take any  new Medicines after you have completely understood and accpet all the possible adverse reactions/side effects.   Increase activity slowly   Complete by: As directed       Discharge Medications   Allergies as of 05/23/2019   No Known Allergies     Medication List    TAKE  these medications   atorvastatin 40 MG tablet Commonly known as: LIPITOR Take 40 mg by mouth daily.   cholecalciferol 25 MCG (1000 UT) tablet Commonly known as: VITAMIN D3 Take 1,000 Units by mouth daily.   clopidogrel 75 MG tablet Commonly known as: PLAVIX Take 75 mg by mouth daily.   donepezil 10 MG tablet Commonly known as: ARICEPT Take 10 mg by mouth daily.   Fish Oil 1000 MG Caps Take 1,000 mg by mouth daily.   Multivitamin Adult Tabs Take 1 tablet by mouth daily.   Namenda 5 MG tablet Generic drug: memantine Take 5 mg by mouth daily.   traZODone 50 MG tablet Commonly known as: DESYREL Take 50 mg by mouth 2 (two) times daily.   vitamin C 1000 MG tablet Take 1,000 mg by mouth daily.            Durable Medical Equipment  (From admission, onward)         Start     Ordered   05/20/19 0722  For home use only DME oxygen  Once    Question Answer Comment  Length of Need 6 Months   Mode or (Route) Nasal cannula   Liters per Minute 2   Frequency Continuous (stationary and portable oxygen unit needed)   Oxygen conserving device Yes   Oxygen delivery system Gas      05/20/19 0722          Follow-up Information    PCP. Schedule an appointment as soon as possible for a visit in 1 week(s).   Why: Follow up with PCP in 1-2 weeks PCP Please obtain BMP/CBC, 2 view CXR in 1week Contact information: Please contact your Primary Care Provider and make an appointment          Major procedures and Radiology Reports - PLEASE review detailed and final reports thoroughly  -       CT Angio Chest PE W and/or Wo Contrast  Result Date: 05/13/2019 CLINICAL DATA:  Show less of breath and  positive D-dimer EXAM: CT ANGIOGRAPHY CHEST WITH CONTRAST TECHNIQUE: Multidetector CT imaging of the chest was performed using the standard protocol during bolus administration of intravenous contrast. Multiplanar CT image reconstructions and MIPs were obtained to evaluate the vascular anatomy. CONTRAST:  144m OMNIPAQUE IOHEXOL 350 MG/ML SOLN COMPARISON:  None. FINDINGS: Cardiovascular: Overall normal heart size but asymmetric dilatation of the right heart. Pulmonary artery visualization is degraded by motion artifact there is diagnostic opacification and visualization to the segmental level throughout the majority of the lungs. No filling defect is seen. There is streaming artifact in the left main pulmonary artery. Mild atherosclerotic calcification of the aorta. Mediastinum/Nodes: Paucity of mediastinal fat with no suspected adenopathy or mass. Lungs/Pleura: Subpleural opacity with coarse reticular appearance. Some of this may be chronic as there is suggestion of some honeycombing and traction bronchiectasis. Dependent atelectasis. No edema, effusion, or pneumothorax. Upper Abdomen: Bilateral hepatic cystic densities. Early opacification of the aorta. Musculoskeletal: No acute or aggressive finding. Review of the MIP images confirms the above findings. IMPRESSION: 1. Interstitial opacities likely related to history of COVID 19. There may be underlying interstitial lung disease. 2. Probable right heart dilatation but no evidence of pulmonary embolism. Electronically Signed   By: JMonte FantasiaM.D.   On: 05/13/2019 08:26   CXR am  Result Date: 05/22/2019 CLINICAL DATA:  Shortness of breath EXAM: PORTABLE CHEST 1 VIEW COMPARISON:  05/18/2027 FINDINGS: Cardiomegaly. Patchy bilateral airspace disease again noted, unchanged compatible  with multifocal pneumonia. No visible significant effusions or pneumothorax. No acute bony abnormality. IMPRESSION: Patchy bilateral airspace disease, not significantly changed  since prior study Electronically Signed   By: Rolm Baptise M.D.   On: 05/22/2019 10:54   DG Chest Port 1 View  Result Date: 05/18/2019 CLINICAL DATA:  Shortness of breath. Additional history provided: Diagnosed with COVID-19 on 12/17. Worsening shortness of breath, hypoxic respiratory failure due to COVID pneumonia. EXAM: PORTABLE CHEST 1 VIEW COMPARISON:  CT angiogram chest 05/13/2019, chest radiograph 05/13/2019 FINDINGS: Heart size at the upper limits of normal. Redemonstrated bilateral interstitial opacities. Superimposed patchy airspace opacities most notable in the left lung base. Underlying interstitial lung disease is again questioned. Overall, findings are similar to prior chest radiograph 05/13/2019. No sizable pleural effusion or evidence of pneumothorax. No acute bony abnormality. IMPRESSION: Bilateral interstitial prominence. Superimposed patchy airspace opacities most notable in the left lower lobe. Overall, findings are similar to prior chest radiograph 05/13/2019 and likely reflect multifocal pneumonia. A background of chronic interstitial lung disease is again questioned. Electronically Signed   By: Kellie Simmering DO   On: 05/18/2019 15:10   DG Chest Port 1 View  Result Date: 05/13/2019 CLINICAL DATA:  Shortness of breath.  COVID-19 positive EXAM: PORTABLE CHEST 1 VIEW COMPARISON:  04/07/2018 FINDINGS: Diffuse interstitial coarsening with patchy airspace type opacities at the left more than right base. No effusion or pneumothorax. Normal heart size and stable aortic contours. Artifact from EKG leads IMPRESSION: Patchy airspace and interstitial opacity correlating with history of COVID-19. Electronically Signed   By: Monte Fantasia M.D.   On: 05/13/2019 04:34   Leg [place]  Result Date: 05/16/2019  Lower Venous Study Indications: Elevated Ddimer.  Risk Factors: COVID 19 positive. Limitations: Poor ultrasound/tissue interface and patient immobility, patient positioning, poor patient  cooperation. Comparison Study: No prior studies. Performing Technologist: Oliver Hum RVT  Examination Guidelines: A complete evaluation includes B-mode imaging, spectral Doppler, color Doppler, and power Doppler as needed of all accessible portions of each vessel. Bilateral testing is considered an integral part of a complete examination. Limited examinations for reoccurring indications may be performed as noted.  +---------+---------------+---------+-----------+----------+--------------+ RIGHT    CompressibilityPhasicitySpontaneityPropertiesThrombus Aging +---------+---------------+---------+-----------+----------+--------------+ CFV      Full           Yes      Yes                                 +---------+---------------+---------+-----------+----------+--------------+ SFJ      Full                                                        +---------+---------------+---------+-----------+----------+--------------+ FV Prox  Full                                                        +---------+---------------+---------+-----------+----------+--------------+ FV Mid   Full                                                        +---------+---------------+---------+-----------+----------+--------------+  FV DistalFull                                                        +---------+---------------+---------+-----------+----------+--------------+ PFV      Full                                                        +---------+---------------+---------+-----------+----------+--------------+ POP      Full           Yes      Yes                                 +---------+---------------+---------+-----------+----------+--------------+ PTV      Full                                                        +---------+---------------+---------+-----------+----------+--------------+ PERO     Full                                                         +---------+---------------+---------+-----------+----------+--------------+   +---------+---------------+---------+-----------+----------+--------------+ LEFT     CompressibilityPhasicitySpontaneityPropertiesThrombus Aging +---------+---------------+---------+-----------+----------+--------------+ CFV      Full           Yes      Yes                                 +---------+---------------+---------+-----------+----------+--------------+ SFJ      Full                                                        +---------+---------------+---------+-----------+----------+--------------+ FV Prox  Full                                                        +---------+---------------+---------+-----------+----------+--------------+ FV Mid   Full                                                        +---------+---------------+---------+-----------+----------+--------------+ FV DistalFull                                                        +---------+---------------+---------+-----------+----------+--------------+  PFV      Full                                                        +---------+---------------+---------+-----------+----------+--------------+ POP      Full           Yes      Yes                                 +---------+---------------+---------+-----------+----------+--------------+ PTV      Full                                                        +---------+---------------+---------+-----------+----------+--------------+ PERO     Full                                                        +---------+---------------+---------+-----------+----------+--------------+     Summary: Right: There is no evidence of deep vein thrombosis in the lower extremity. However, portions of this examination were limited- see technologist comments above. No cystic structure found in the popliteal fossa. Left: There is no evidence of deep vein thrombosis  in the lower extremity. However, portions of this examination were limited- see technologist comments above. No cystic structure found in the popliteal fossa.  *See table(s) above for measurements and observations. Electronically signed by Deitra Mayo MD on 05/16/2019 at 3:45:52 PM.    Final     Micro Results     No results found for this or any previous visit (from the past 240 hour(s)).  Today   Subjective    Adriana Fox today has no headache,no chest abdominal pain,no new weakness tingling or numbness    Objective   Blood pressure 119/80, pulse 98, temperature 97.6 F (36.4 C), temperature source Axillary, resp. rate 20, height '5\' 6"'$  (1.676 m), weight 50.1 kg, SpO2 92%   Intake/Output Summary (Last 24 hours) at 05/23/2019 0937 Last data filed at 05/23/2019 0500 Gross per 24 hour  Intake 1010 ml  Output 800 ml  Net 210 ml    Exam Awake, pleasantly confused, No new F.N deficits,   [place].AT,PERRAL Supple Neck,No JVD, No cervical lymphadenopathy appriciated.  Symmetrical Chest wall movement, Good air movement bilaterally, CTAB RRR,No Gallops,Rubs or new Murmurs, No Parasternal Heave +ve B.Sounds, Abd Soft, Non tender, No organomegaly appriciated, No rebound -guarding or rigidity. No Cyanosis, Clubbing or edema, No new Rash or bruise   Data Review   CBC w Diff:  Lab Results  Component Value Date   WBC 8.3 05/23/2019   HGB 13.4 05/23/2019   HCT 42.9 05/23/2019   PLT 268 05/23/2019   LYMPHOPCT 19 05/23/2019   MONOPCT 10 05/23/2019   EOSPCT 4 05/23/2019   BASOPCT 1 05/23/2019    CMP:  Lab Results  Component Value Date   NA 144 05/23/2019   K 3.6 05/23/2019   CL 108 05/23/2019   CO2 26 05/23/2019  BUN 21 05/23/2019   CREATININE 0.59 05/23/2019   PROT 6.0 (L) 05/23/2019   ALBUMIN 2.9 (L) 05/23/2019   BILITOT 0.8 05/23/2019   ALKPHOS 118 05/23/2019   AST 54 (H) 05/23/2019   ALT 68 (H) 05/23/2019  .   Total Time in preparing paper work, data evaluation  and todays exam - 7 minutes  Lala Lund M.D on 05/23/2019 at 9:37 AM  [facility]   Office  [phone]

## 2019-05-23 LAB — D-DIMER, QUANTITATIVE: D-Dimer, Quant: 3.62 ug/mL-FEU — ABNORMAL HIGH (ref 0.00–0.50)

## 2019-05-23 LAB — CBC WITH DIFFERENTIAL/PLATELET
Abs Immature Granulocytes: 0.05 10*3/uL (ref 0.00–0.07)
Basophils Absolute: 0 10*3/uL (ref 0.0–0.1)
Basophils Relative: 1 %
Eosinophils Absolute: 0.3 10*3/uL (ref 0.0–0.5)
Eosinophils Relative: 4 %
HCT: 42.9 % (ref 36.0–46.0)
Hemoglobin: 13.4 g/dL (ref 12.0–15.0)
Immature Granulocytes: 1 %
Lymphocytes Relative: 19 %
Lymphs Abs: 1.6 10*3/uL (ref 0.7–4.0)
MCH: 30.8 pg (ref 26.0–34.0)
MCHC: 31.2 g/dL (ref 30.0–36.0)
MCV: 98.6 fL (ref 80.0–100.0)
Monocytes Absolute: 0.9 10*3/uL (ref 0.1–1.0)
Monocytes Relative: 10 %
Neutro Abs: 5.5 10*3/uL (ref 1.7–7.7)
Neutrophils Relative %: 65 %
Platelets: 268 10*3/uL (ref 150–400)
RBC: 4.35 MIL/uL (ref 3.87–5.11)
RDW: 19.3 % — ABNORMAL HIGH (ref 11.5–15.5)
WBC: 8.3 10*3/uL (ref 4.0–10.5)
nRBC: 0 % (ref 0.0–0.2)

## 2019-05-23 LAB — GLUCOSE, CAPILLARY
Glucose-Capillary: 120 mg/dL — ABNORMAL HIGH (ref 70–99)
Glucose-Capillary: 132 mg/dL — ABNORMAL HIGH (ref 70–99)

## 2019-05-23 LAB — COMPREHENSIVE METABOLIC PANEL
ALT: 68 U/L — ABNORMAL HIGH (ref 0–44)
AST: 54 U/L — ABNORMAL HIGH (ref 15–41)
Albumin: 2.9 g/dL — ABNORMAL LOW (ref 3.5–5.0)
Alkaline Phosphatase: 118 U/L (ref 38–126)
Anion gap: 10 (ref 5–15)
BUN: 21 mg/dL (ref 8–23)
CO2: 26 mmol/L (ref 22–32)
Calcium: 8.6 mg/dL — ABNORMAL LOW (ref 8.9–10.3)
Chloride: 108 mmol/L (ref 98–111)
Creatinine, Ser: 0.59 mg/dL (ref 0.44–1.00)
GFR calc Af Amer: >60 mL/min (ref >60)
GFR calc non Af Amer: >60 mL/min (ref >60)
Glucose, Bld: 119 mg/dL — ABNORMAL HIGH (ref 70–99)
Potassium: 3.6 mmol/L (ref 3.5–5.1)
Sodium: 144 mmol/L (ref 135–145)
Total Bilirubin: 0.8 mg/dL (ref 0.3–1.2)
Total Protein: 6 g/dL — ABNORMAL LOW (ref 6.5–8.1)

## 2019-05-23 LAB — BRAIN NATRIURETIC PEPTIDE: B Natriuretic Peptide: 266.6 pg/mL — ABNORMAL HIGH (ref 0.0–100.0)

## 2019-05-23 LAB — MAGNESIUM: Magnesium: 1.8 mg/dL (ref 1.7–2.4)

## 2019-05-23 LAB — C-REACTIVE PROTEIN: CRP: 0.6 mg/dL (ref <1.0)

## 2019-05-23 NOTE — TOC Progression Note (Signed)
Transition of Care [facility]) - Progression Note    Patient Details  Name: Adriana Fox MRN: [id number] Date of Birth: 09/18/1945  Transition of [facility]) CM/SW Contact  Leitha Bleak, RN Phone Number: 05/23/2019, 12:31 PM  Clinical Narrative:   CM reaching out to [facility] again, patient up for discharge.  Per Thayer Ohm they have no bed today. Possibly tomorrow. MD and RN updated.

## 2019-05-23 NOTE — Progress Notes (Signed)
Pt awake, looking slightly anxious, suspiscious, and weary of RN in room. Pt is a/ox1 (self). Haldol through the evening when she started to pull off clothing and bedding. Pt is abefrile. Discharge pklans to LTC,SNf. Pt no longer needs tele.  Pt has purewick for urination/collection, with pts restlessness, at times leakage noted. Pericare doen mutiple times throughout the PM/AM. Sacral keroforam awaits to be replaced, as pt has "picked off" two protective coccyx allevyns this shift. Pills crushed in applesauce/pudding, pt encouraged to take sips of liquids for hydration, but te4aching not received well. Will c/t monitor pts status, update the am shift, matts on floor, bed in low position, bed alarms inplace and teaching attempted with pt. CBG s continue q 6am/pm  and 12pm/am. No coverage noted.

## 2019-05-23 NOTE — Clinical Social Work Note (Signed)
Handoff stating that patient is okay to dc to [facility]). CSW called and left message for admissions coordinator at 8:32 am.   As of 11:26am CSW has not heard back from facility. Will leave handoff for TOC to continue to check with facility as patient is medically ready for discharge.

## 2019-05-23 NOTE — Consult Note (Signed)
WOC Nurse Consult Note: Reason for Consult: Stage 3 pressure injury to right buttock at gluteal cleft. See photo taken today. Wound type: Pressure Pressure Injury POA: Yes Measurement: 1.5cm x 0.5cm x 0.2cm Wound bed: Pink, moist Drainage (amount, consistency, odor) small amount serous to light yellow Periwound: Macerated periphery (white tissue), intact Dressing procedure/placement/frequency: I will protect the periwound skin while providing a moisture retentive dressing that is nonadherent and antimicrobial (xeroform).  This will be topped with a silicone foam dressing. Patient will be turned from side to side and the supine position avoided. The heels will be floated for pressure injury prevention. Patient is pending bed availability for discharge back to facility.  WOC nursing team will not follow, but will remain available to this patient, the nursing and medical teams.  Please re-consult if needed. Thanks, Ladona Mow, MSN, RN, GNP, [facility]  Pager# [phone]

## 2019-05-23 NOTE — Progress Notes (Signed)
Pt noted lying in bed awake, 02 @90 -92% on RA, no respiratory distress observed. RR even and unlabored, nothing further to note at this time

## 2019-05-23 NOTE — Progress Notes (Signed)
Attempted to call daughter Kina to provide update on pt condition. No answer. Voicemail left w/callback number. Nothing further to note.  

## 2019-05-23 NOTE — Progress Notes (Signed)
Pt daughter returned call bac to RN, update provided. Pt asking to speak w/MD for more detailed POC. Request sent to MD. Nothing further to note.

## 2019-05-24 LAB — GLUCOSE, CAPILLARY
Glucose-Capillary: 109 mg/dL — ABNORMAL HIGH (ref 70–99)
Glucose-Capillary: 157 mg/dL — ABNORMAL HIGH (ref 70–99)
Glucose-Capillary: 97 mg/dL (ref 70–99)

## 2019-05-24 NOTE — Progress Notes (Signed)
Attempted to call daughter Annetta Maw to provide update on pt condition. No answer. Voicemail left w/callback number. Nothing further to note.

## 2019-05-24 NOTE — Progress Notes (Signed)
Pt daughter returned phonecall to RN. Update provided. Daughter also updated on transfer to [facility], daughter acknowledged and in agreement w/transfer. SNF contact information shared w/daughter. All questions/concerns addressed. Nothing further to note.

## 2019-05-24 NOTE — NC FL2 (Signed)
[place] LEVEL OF CARE SCREENING TOOL     IDENTIFICATION  Patient Name: Nyjah Schwake Birthdate: [birth year] Sex: female Admission Date (Current Location): 05/13/2019  [facility] and [place] Number:  Herbalist and Address:  [facility] [place]. [facility], [place] [address] [place],  [address]      Provider Number: [id number]  Attending Physician Name and Address:  Thurnell Lose, MD  Relative Name and Phone Number:  Adda Stokes    [phone]    Current Level of Care: Hospital Recommended Level of Care: [place] Prior Approval Number:    Date Approved/Denied:   PASRR Number: [id number]  Discharge Plan: SNF    Current Diagnoses: Patient Active Problem List   Diagnosis Date Noted  . Sepsis ([place]) 05/13/2019  . Pneumonia due to COVID-19 virus 05/13/2019  . Hypokalemia 05/13/2019  . Dementia ([place]) 05/13/2019  . Prolonged QT interval 05/13/2019  . History of CVA with residual deficit 05/13/2019  . Dehydration 05/13/2019    Orientation RESPIRATION BLADDER Height & Weight        O2 External catheter Weight: 110 lb 7.2 oz (50.1 kg) Height:  5\' 6"  (167.6 cm)  BEHAVIORAL SYMPTOMS/MOOD NEUROLOGICAL BOWEL NUTRITION STATUS      Incontinent Diet(see discharge summary)  AMBULATORY STATUS COMMUNICATION OF NEEDS Skin   Total Care Verbally Bruising, Normal                       Personal Care Assistance Level of Assistance  Total care       Total Care Assistance: Maximum assistance   Functional Limitations Info             SPECIAL CARE FACTORS FREQUENCY  PT (By licensed PT), OT (By licensed OT)     PT Frequency: 5 times a week OT Frequency: 5 times a week            Contractures      Additional Factors Info  Allergies, Isolation Precautions Code Status Info: Full Allergies Info: NKA     Isolation Precautions Info: Airborne/Contact Precautions: COVID+     Current Medications (05/24/2019):  This  is the current hospital active medication list Current Facility-Administered Medications  Medication Dose Route Frequency Provider Last Rate Last Admin  . albuterol (VENTOLIN HFA) 108 (90 Base) MCG/ACT inhaler 2 puff  2 puff Inhalation Q6H Fuller Plan A, MD   2 puff at 05/24/19 0857  . ascorbic acid (VITAMIN C) tablet 500 mg  500 mg Oral Daily Tamala Julian, Rondell A, MD   500 mg at 05/24/19 0854  . atorvastatin (LIPITOR) tablet 40 mg  40 mg Oral Daily Fuller Plan A, MD   40 mg at 05/24/19 0854  . clopidogrel (PLAVIX) tablet 75 mg  75 mg Oral Daily Fuller Plan A, MD   75 mg at 05/24/19 0855  . enoxaparin (LOVENOX) injection 30 mg  30 mg Subcutaneous Q12H Thurnell Lose, MD   30 mg at 05/24/19 0859  . famotidine (PEPCID) tablet 20 mg  20 mg Oral BID Fuller Plan A, MD   20 mg at 05/24/19 0854  . guaiFENesin-dextromethorphan (ROBITUSSIN DM) 100-10 MG/5ML syrup 10 mL  10 mL Oral Q4H PRN Fuller Plan A, MD   10 mL at 05/21/19 0121  . haloperidol lactate (HALDOL) injection 1 mg  1 mg Intravenous Q6H PRN Peyton Bottoms, MD   1 mg at 05/22/19 2106  . memantine (NAMENDA) tablet 5 mg  5 mg  Oral Daily Madelyn Flavors A, MD   5 mg at 05/24/19 0855  . sodium chloride flush (NS) 0.9 % injection 3 mL  3 mL Intravenous Q12H Smith, Rondell A, MD   3 mL at 05/24/19 0859  . traZODone (DESYREL) tablet 25 mg  25 mg Oral BID Leroy Sea, MD   25 mg at 05/24/19 0855  . zinc sulfate capsule 220 mg  220 mg Oral Daily Madelyn Flavors A, MD   220 mg at 05/24/19 1580     Discharge Medications: Please see discharge summary for a list of discharge medications.  Relevant Imaging Results:  Relevant Lab Results:   Additional Information ss # [id number] Will require 30 days of skilled services  Baldemar Lenis, LCSW

## 2019-05-24 NOTE — NC FL2 (Signed)
[place] LEVEL OF CARE SCREENING TOOL     IDENTIFICATION  Patient Name: Adriana Fox Birthdate: 01-Feb-1946 Sex: female Admission Date (Current Location): 05/13/2019  [facility] and [place] Number:  Herbalist and Address:  [facility] [place]. [facility], [place] [address], [place], [place] [address]      Provider Number: [id number]  Attending Physician Name and Address:  Thurnell Lose, MD  Relative Name and Phone Number:  Khamille Beynon    [phone]    Current Level of Care: Hospital Recommended Level of Care: [place] Prior Approval Number:    Date Approved/Denied:   PASRR Number: [id number]  Discharge Plan: SNF    Current Diagnoses: Patient Active Problem List   Diagnosis Date Noted  . Sepsis ([place]) 05/13/2019  . Pneumonia due to COVID-19 virus 05/13/2019  . Hypokalemia 05/13/2019  . Dementia ([place]) 05/13/2019  . Prolonged QT interval 05/13/2019  . History of CVA with residual deficit 05/13/2019  . Dehydration 05/13/2019    Orientation RESPIRATION BLADDER Height & Weight        O2 External catheter Weight: 110 lb 7.2 oz (50.1 kg) Height:  5\' 6"  (167.6 cm)  BEHAVIORAL SYMPTOMS/MOOD NEUROLOGICAL BOWEL NUTRITION STATUS      Incontinent Diet(see discharge summary)  AMBULATORY STATUS COMMUNICATION OF NEEDS Skin   Total Care Verbally Bruising, Normal                       Personal Care Assistance Level of Assistance  Total care       Total Care Assistance: Maximum assistance   Functional Limitations Info             SPECIAL CARE FACTORS FREQUENCY  PT (By licensed PT), OT (By licensed OT)     PT Frequency: 5 times a week OT Frequency: 5 times a week            Contractures      Additional Factors Info  Allergies, Isolation Precautions Code Status Info: Full Allergies Info: NKA     Isolation Precautions Info: Airborne/Contact Precautions: COVID+     Current Medications (05/24/2019):  This  is the current hospital active medication list Current Facility-Administered Medications  Medication Dose Route Frequency Provider Last Rate Last Admin  . albuterol (VENTOLIN HFA) 108 (90 Base) MCG/ACT inhaler 2 puff  2 puff Inhalation Q6H Fuller Plan A, MD   2 puff at 05/24/19 0857  . ascorbic acid (VITAMIN C) tablet 500 mg  500 mg Oral Daily Tamala Julian, Rondell A, MD   500 mg at 05/24/19 0854  . atorvastatin (LIPITOR) tablet 40 mg  40 mg Oral Daily Fuller Plan A, MD   40 mg at 05/24/19 0854  . clopidogrel (PLAVIX) tablet 75 mg  75 mg Oral Daily Fuller Plan A, MD   75 mg at 05/24/19 0855  . enoxaparin (LOVENOX) injection 30 mg  30 mg Subcutaneous Q12H Thurnell Lose, MD   30 mg at 05/24/19 0859  . famotidine (PEPCID) tablet 20 mg  20 mg Oral BID Fuller Plan A, MD   20 mg at 05/24/19 0854  . guaiFENesin-dextromethorphan (ROBITUSSIN DM) 100-10 MG/5ML syrup 10 mL  10 mL Oral Q4H PRN Fuller Plan A, MD   10 mL at 05/21/19 0121  . haloperidol lactate (HALDOL) injection 1 mg  1 mg Intravenous Q6H PRN Peyton Bottoms, MD   1 mg at 05/22/19 2106  . memantine (NAMENDA) tablet 5 mg  5 mg  Oral Daily Madelyn Flavors A, MD   5 mg at 05/24/19 0855  . sodium chloride flush (NS) 0.9 % injection 3 mL  3 mL Intravenous Q12H Smith, Rondell A, MD   3 mL at 05/24/19 0859  . traZODone (DESYREL) tablet 25 mg  25 mg Oral BID Leroy Sea, MD   25 mg at 05/24/19 0855  . zinc sulfate capsule 220 mg  220 mg Oral Daily Madelyn Flavors A, MD   220 mg at 05/24/19 7076     Discharge Medications: Please see discharge summary for a list of discharge medications.  Relevant Imaging Results:  Relevant Lab Results:   Additional Information ss # [phone] [id number] Will require 30 days of skilled services  Truddie Hidden, LCSW

## 2019-05-24 NOTE — TOC Transition Note (Signed)
Transition of Care [facility]) - CM/SW Discharge Note   Patient Details  Name: Osie Merkin MRN: [id number] Date of Birth: 02-08-1946  Transition of Care [facility]) CM/SW Contact:  Baldemar Lenis, LCSW Phone Number: 05/24/2019, 12:33 PM   Clinical Narrative:   Nurse to call report to [phone], Ask for [facility] Nurse    Final next level of care: Skilled Nursing Facility Barriers to Discharge: Barriers Resolved   Patient Goals and CMS Choice   CMS Medicare.gov Compare Post Acute Care list provided to:: Patient Represenative (must comment)(daughter Pina) Choice offered to / list presented to : Adult Children  Discharge Placement              Patient chooses bed at: [facility] Patient to be transferred to facility by: [facility] Name of family member notified: Annetta Maw, left a voicemail Patient and family notified of of transfer: 05/24/19  Discharge Plan and Services                          HH Arranged: PT, OT [facility] Agency: [facility] Date [facility] Agency Contacted: 05/16/19 Time HH Agency Contacted: [id number] Representative spoke with at [facility] Agency: Kandee Keen  Social Determinants of Health (SDOH) Interventions     Readmission Risk Interventions No flowsheet data found.

## 2019-05-24 NOTE — Progress Notes (Signed)
Pt feeding self w/supervision, set-up and cueing. Nothing further to note

## 2019-05-24 NOTE — Progress Notes (Signed)
[facility]                                                                                                                                                                         Patient Demographics  Adriana Fox, is a 74 y.o. female  XBJ:[id number]  HYQ:[id number]  DOB - 12-20-1945  Admit date - 05/13/2019  Admitting Physician Norval Morton, MD  Outpatient Primary MD for the patient is Patient, No Pcp Per  LOS - 69   Chief Complaint  Patient presents with  . Hypotension  . Weakness  . Shortness of Breath        Assessment & Plan    Patient seen briefly today due for discharge soon per Discharge done by me 2 days ago, we still await SNF bed, no further issues, Vital signs stable, patient feels fine.    Note her oxygen readings are extremely dependent on her body position and oxygen probe, multiple times her probe has been adjusted with oxygen requirement less than 2 L however if she changes posture or the probe is pulled her pulse ox drops.   I had updated patient's daughter in detail on 05/23/2019 patient's oral intake is tenuous and that this might be a sign of terminal decline due to dementia, if this happens at SNF again to focus on comfort.  She understands it.    Medications  Scheduled Meds: . albuterol  2 puff Inhalation Q6H  . vitamin C  500 mg Oral Daily  . atorvastatin  40 mg Oral Daily  . clopidogrel  75 mg Oral Daily  . enoxaparin (LOVENOX) injection  30 mg Subcutaneous Q12H  . famotidine  20 mg Oral BID  . memantine  5 mg Oral Daily  . sodium chloride flush  3 mL Intravenous Q12H  . traZODone  25 mg Oral BID  . zinc sulfate  220 mg Oral Daily   Continuous Infusions: PRN Meds:.guaiFENesin-dextromethorphan, haloperidol lactate    Time Spent in minutes   10 minutes   Lala Lund M.D on 05/24/2019 at 9:17 AM  Between 7am to 7pm - Pager - [phone]  After 7pm go  to [email or web address] - password [email or web address]  And look for the night coverage person covering for me after hours  [facility] Office  [phone]    Subjective:   Adriana Fox today has, No headache, No chest pain, No abdominal pain - No Nausea, No new weakness tingling or numbness, No Cough - SOB.    Objective:   Vitals:   05/23/19 1940 05/24/19 0326 05/24/19 0752 05/24/19 0807  BP: 99/72 102/65 (!) 128/98   Pulse: (!) 105  88   Resp: 18 18 19    Temp: 98.5 F (36.9  C) 98.7 F (37.1 C) 98.9 F (37.2 C)   TempSrc: Axillary Axillary Oral   SpO2: 99% 92% 91% (!) 81%  Weight:      Height:        Wt Readings from Last 3 Encounters:  05/13/19 50.1 kg     Intake/Output Summary (Last 24 hours) at 05/24/2019 0917 Last data filed at 05/24/2019 0030 Gross per 24 hour  Intake 600 ml  Output 900 ml  Net -300 ml    Exam Awake but remains pleasantly confused, in no distress  [place].AT,PERRAL Supple Neck,No JVD, No cervical lymphadenopathy appriciated.  Symmetrical Chest wall movement, Good air movement bilaterally, CTAB RRR,No Gallops,Rubs or new Murmurs, No Parasternal Heave +ve B.Sounds, Abd Soft, Non tender, No organomegaly appriciated, No rebound - guarding or rigidity. No Cyanosis, Clubbing or edema, No new Rash or bruise      Data Review

## 2019-05-24 NOTE — Progress Notes (Signed)
Report given to [facility] at [facility].

## 2019-05-25 LAB — GLUCOSE, CAPILLARY: Glucose-Capillary: 97 mg/dL (ref 70–99)

## 2021-05-01 IMAGING — DX DG CHEST 1V PORT
1 series · 1 of 1 positions shown · non-contrast
Comparison: 05/18/2027

CLINICAL DATA: Shortness of breath

EXAM:
PORTABLE CHEST 1 VIEW

[chest]
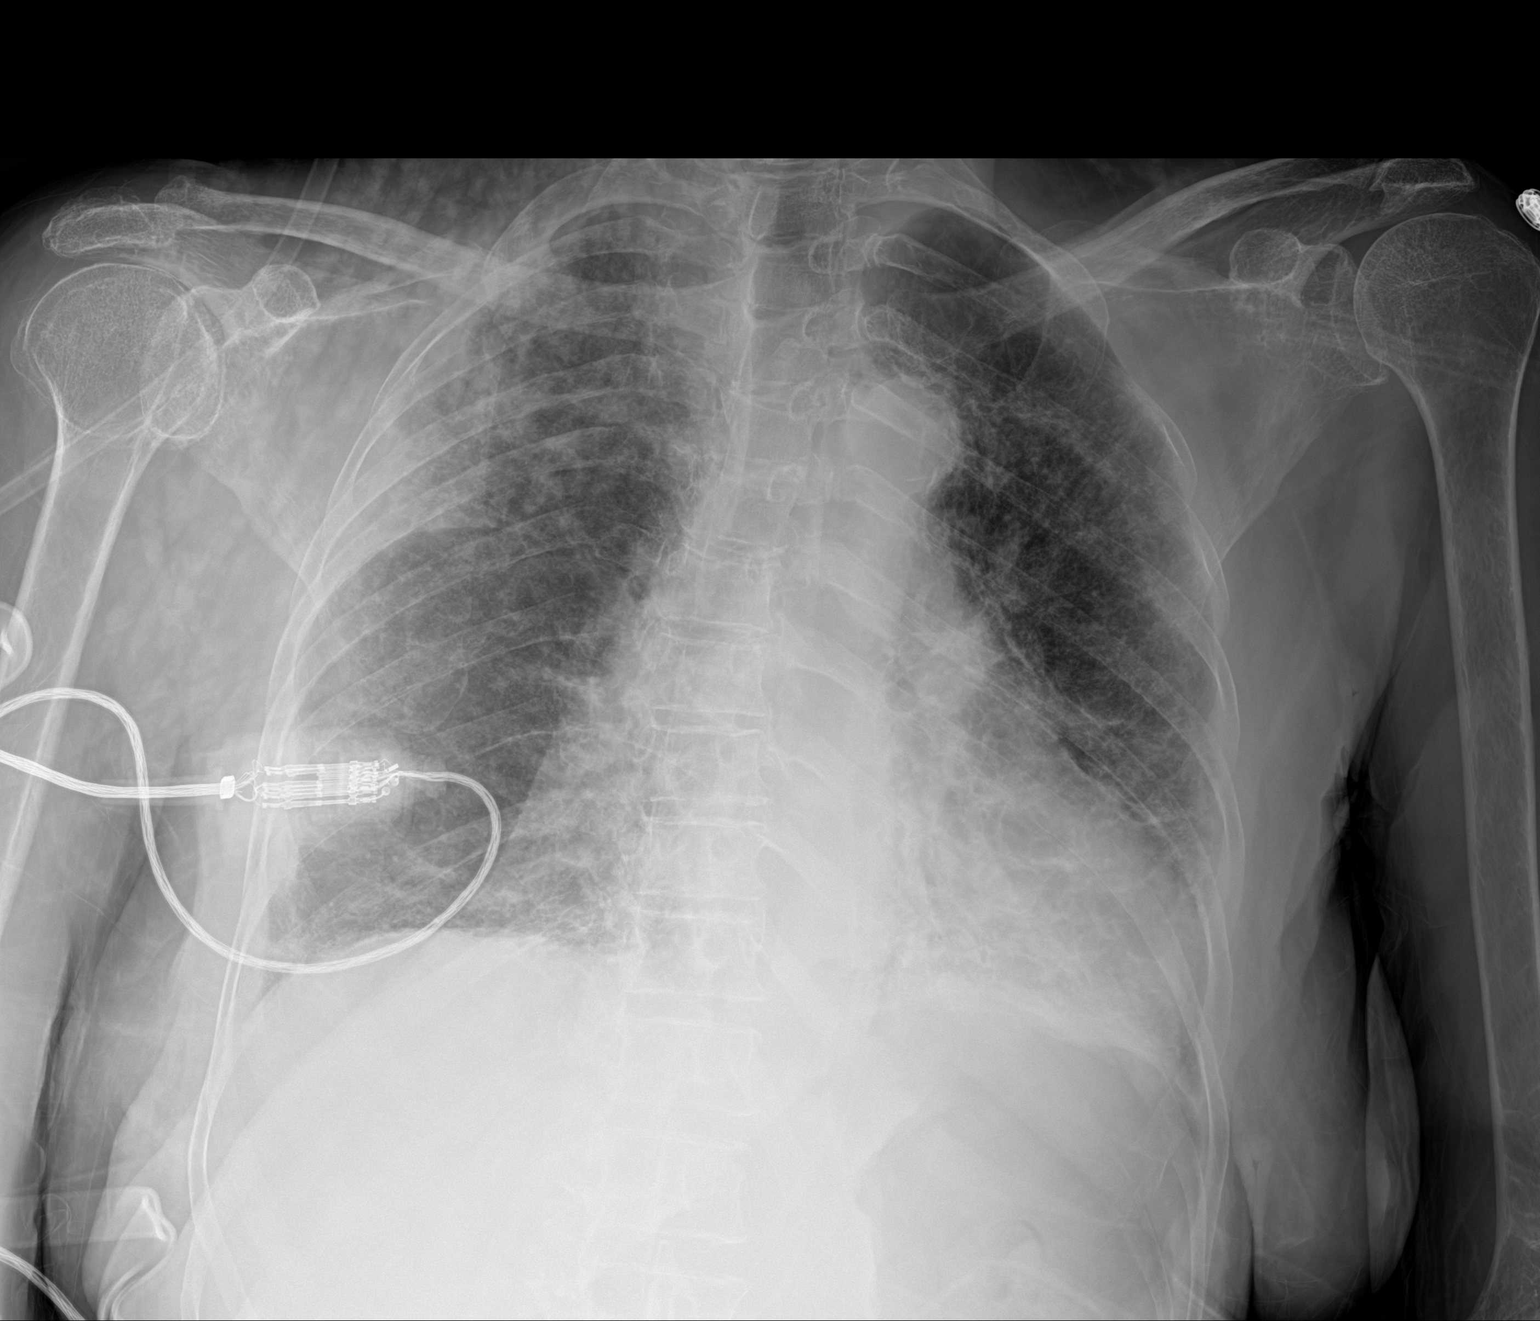

[1 of 1 positions shown; findings below may reference images not displayed]

FINDINGS: Cardiomegaly. Patchy bilateral airspace disease again noted,
unchanged compatible with multifocal pneumonia. No visible
significant effusions or pneumothorax. No acute bony abnormality.
IMPRESSION: Patchy bilateral airspace disease, not significantly changed since
prior study
